# Patient Record
Sex: Female | Born: 1985 | Race: Black or African American | Hispanic: No | Marital: Single | Smoking: Current every day smoker
Health system: Southern US, Community
[De-identification: ages and names within clinical notes are randomized; demographics above are authoritative.]

## PROBLEM LIST (undated history)

## (undated) ENCOUNTER — Emergency Department (HOSPITAL_COMMUNITY): Admission: EM | Discharge status: Absent without leave | Payer: Medicaid Other

## (undated) ENCOUNTER — Inpatient Hospital Stay (HOSPITAL_COMMUNITY): Payer: Self-pay

## (undated) DIAGNOSIS — F32A Depression, unspecified: Secondary | ICD-10-CM

## (undated) DIAGNOSIS — F419 Anxiety disorder, unspecified: Secondary | ICD-10-CM

## (undated) DIAGNOSIS — F329 Major depressive disorder, single episode, unspecified: Secondary | ICD-10-CM

## (undated) DIAGNOSIS — O139 Gestational [pregnancy-induced] hypertension without significant proteinuria, unspecified trimester: Secondary | ICD-10-CM

## (undated) HISTORY — PX: NO PAST SURGERIES: SHX2092

---
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
## 2005-09-14 ENCOUNTER — Emergency Department (HOSPITAL_COMMUNITY): Admission: EM | Admit: 2005-09-14 | Discharge: 2005-09-14 | Payer: Self-pay | Admitting: Emergency Medicine

## 2006-01-19 ENCOUNTER — Emergency Department (HOSPITAL_COMMUNITY): Admission: EM | Admit: 2006-01-19 | Discharge: 2006-01-19 | Payer: Self-pay | Admitting: Emergency Medicine

## 2008-01-12 ENCOUNTER — Emergency Department (HOSPITAL_COMMUNITY): Admission: EM | Admit: 2008-01-12 | Discharge: 2008-01-13 | Payer: Self-pay | Admitting: Emergency Medicine

## 2009-02-28 ENCOUNTER — Inpatient Hospital Stay (HOSPITAL_COMMUNITY): Admission: AD | Admit: 2009-02-28 | Discharge: 2009-02-28 | Payer: Self-pay | Admitting: Obstetrics and Gynecology

## 2009-05-22 ENCOUNTER — Inpatient Hospital Stay (HOSPITAL_COMMUNITY): Admission: AD | Admit: 2009-05-22 | Discharge: 2009-05-22 | Payer: Self-pay | Admitting: Obstetrics & Gynecology

## 2009-07-09 ENCOUNTER — Inpatient Hospital Stay (HOSPITAL_COMMUNITY): Admission: AD | Admit: 2009-07-09 | Discharge: 2009-07-10 | Payer: Self-pay | Admitting: Obstetrics & Gynecology

## 2009-08-06 ENCOUNTER — Inpatient Hospital Stay (HOSPITAL_COMMUNITY): Admission: AD | Admit: 2009-08-06 | Discharge: 2009-08-06 | Payer: Self-pay | Admitting: Obstetrics and Gynecology

## 2009-08-14 ENCOUNTER — Ambulatory Visit: Payer: Self-pay | Admitting: Obstetrics & Gynecology

## 2009-09-01 ENCOUNTER — Ambulatory Visit: Payer: Self-pay | Admitting: Obstetrics & Gynecology

## 2009-09-18 ENCOUNTER — Encounter: Payer: Self-pay | Admitting: Obstetrics & Gynecology

## 2009-09-18 ENCOUNTER — Ambulatory Visit: Payer: Self-pay | Admitting: Obstetrics & Gynecology

## 2009-09-18 LAB — CONVERTED CEMR LAB
AST: 15 units/L (ref 0–37)
Albumin: 3.6 g/dL (ref 3.5–5.2)
Alkaline Phosphatase: 215 units/L — ABNORMAL HIGH (ref 39–117)
BUN: 9 mg/dL (ref 6–23)
Chlamydia, DNA Probe: NEGATIVE
Hemoglobin: 12.7 g/dL (ref 12.0–15.0)
MCHC: 32.3 g/dL (ref 30.0–36.0)
MCV: 88.7 fL (ref 78.0–100.0)
Potassium: 4.7 meq/L (ref 3.5–5.3)
RDW: 13.4 % (ref 11.5–15.5)
Sodium: 138 meq/L (ref 135–145)
Total Bilirubin: 0.3 mg/dL (ref 0.3–1.2)

## 2009-09-19 ENCOUNTER — Encounter: Payer: Self-pay | Admitting: Obstetrics & Gynecology

## 2009-09-25 ENCOUNTER — Encounter: Payer: Self-pay | Admitting: Advanced Practice Midwife

## 2009-09-25 ENCOUNTER — Ambulatory Visit: Payer: Self-pay | Admitting: Obstetrics & Gynecology

## 2009-09-25 LAB — CONVERTED CEMR LAB
AST: 15 units/L (ref 0–37)
Alkaline Phosphatase: 219 units/L — ABNORMAL HIGH (ref 39–117)
BUN: 9 mg/dL (ref 6–23)
Creatinine, Ser: 0.56 mg/dL (ref 0.40–1.20)
Glucose, Bld: 85 mg/dL (ref 70–99)
Hemoglobin: 13.1 g/dL (ref 12.0–15.0)
MCHC: 33.3 g/dL (ref 30.0–36.0)
MCV: 86.9 fL (ref 78.0–100.0)
Potassium: 4.3 meq/L (ref 3.5–5.3)
RBC: 4.52 M/uL (ref 3.87–5.11)
RDW: 13.8 % (ref 11.5–15.5)
Total Bilirubin: 0.3 mg/dL (ref 0.3–1.2)

## 2009-09-26 ENCOUNTER — Inpatient Hospital Stay (HOSPITAL_COMMUNITY): Admission: AD | Admit: 2009-09-26 | Discharge: 2009-09-26 | Payer: Self-pay | Admitting: Family Medicine

## 2009-09-26 ENCOUNTER — Ambulatory Visit: Payer: Self-pay | Admitting: Obstetrics & Gynecology

## 2009-09-26 ENCOUNTER — Ambulatory Visit: Payer: Self-pay | Admitting: Advanced Practice Midwife

## 2009-09-26 ENCOUNTER — Encounter: Payer: Self-pay | Admitting: Obstetrics & Gynecology

## 2009-09-26 LAB — CONVERTED CEMR LAB
Collection Interval-CRCL: 24 hr
Creatinine 24 HR UR: 1779 mg/24hr (ref 700–1800)
Creatinine Clearance: 221 mL/min — ABNORMAL HIGH (ref 75–115)
Creatinine, Urine: 154.7 mg/dL
Protein, Ur: 138 mg/24hr — ABNORMAL HIGH (ref 50–100)

## 2009-10-04 ENCOUNTER — Ambulatory Visit: Payer: Self-pay | Admitting: Advanced Practice Midwife

## 2009-10-04 ENCOUNTER — Inpatient Hospital Stay (HOSPITAL_COMMUNITY): Admission: AD | Admit: 2009-10-04 | Discharge: 2009-10-06 | Payer: Self-pay | Admitting: Obstetrics & Gynecology

## 2009-10-06 ENCOUNTER — Encounter: Payer: Self-pay | Admitting: Obstetrics & Gynecology

## 2011-02-10 LAB — COMPREHENSIVE METABOLIC PANEL
ALT: 13 U/L (ref 0–35)
AST: 26 U/L (ref 0–37)
Albumin: 2.8 g/dL — ABNORMAL LOW (ref 3.5–5.2)
Albumin: 3.2 g/dL — ABNORMAL LOW (ref 3.5–5.2)
Alkaline Phosphatase: 263 U/L — ABNORMAL HIGH (ref 39–117)
BUN: 5 mg/dL — ABNORMAL LOW (ref 6–23)
Chloride: 101 mEq/L (ref 96–112)
Chloride: 105 mEq/L (ref 96–112)
Creatinine, Ser: 0.57 mg/dL (ref 0.4–1.2)
GFR calc Af Amer: >60 mL/min (ref >60)
GFR calc non Af Amer: >60 mL/min (ref >60)
Glucose, Bld: 91 mg/dL (ref 70–99)
Potassium: 4.1 mEq/L (ref 3.5–5.1)
Sodium: 134 mEq/L — ABNORMAL LOW (ref 135–145)
Total Bilirubin: 0.5 mg/dL (ref 0.3–1.2)
Total Bilirubin: 0.6 mg/dL (ref 0.3–1.2)
Total Protein: 6.9 g/dL (ref 6.0–8.3)

## 2011-02-10 LAB — POCT URINALYSIS DIP (DEVICE)
Glucose, UA: NEGATIVE mg/dL
Glucose, UA: NEGATIVE mg/dL
Nitrite: NEGATIVE
Nitrite: NEGATIVE
Protein, ur: 30 mg/dL — AB
Protein, ur: NEGATIVE mg/dL
Specific Gravity, Urine: 1.02 (ref 1.005–1.030)
Urobilinogen, UA: 0.2 mg/dL (ref 0.0–1.0)
Urobilinogen, UA: 0.2 mg/dL (ref 0.0–1.0)

## 2011-02-10 LAB — CBC
HCT: 39.1 % (ref 36.0–46.0)
HCT: 41.9 % (ref 36.0–46.0)
Platelets: 138 10*3/uL — ABNORMAL LOW (ref 150–400)
Platelets: 149 10*3/uL — ABNORMAL LOW (ref 150–400)
RDW: 14.2 % (ref 11.5–15.5)
WBC: 11.9 10*3/uL — ABNORMAL HIGH (ref 4.0–10.5)
WBC: 12.6 10*3/uL — ABNORMAL HIGH (ref 4.0–10.5)

## 2011-02-10 LAB — LIPASE, BLOOD: Lipase: 19 U/L (ref 11–59)

## 2011-02-10 LAB — RAPID URINE DRUG SCREEN, HOSP PERFORMED
Benzodiazepines: NOT DETECTED
Cocaine: NOT DETECTED
Opiates: NOT DETECTED
Tetrahydrocannabinol: POSITIVE — AB

## 2011-02-10 LAB — AMYLASE: Amylase: 109 U/L (ref 27–131)

## 2011-02-10 LAB — URIC ACID: Uric Acid, Serum: 6.5 mg/dL (ref 2.4–7.0)

## 2011-02-11 LAB — POCT URINALYSIS DIP (DEVICE)
Bilirubin Urine: NEGATIVE
Glucose, UA: NEGATIVE mg/dL
Ketones, ur: NEGATIVE mg/dL
Nitrite: NEGATIVE
Nitrite: NEGATIVE
Protein, ur: NEGATIVE mg/dL
Urobilinogen, UA: 1 mg/dL (ref 0.0–1.0)
pH: 7 (ref 5.0–8.0)
pH: 7 (ref 5.0–8.0)

## 2011-02-12 LAB — URINALYSIS, ROUTINE W REFLEX MICROSCOPIC
Bilirubin Urine: NEGATIVE
Ketones, ur: NEGATIVE mg/dL
Nitrite: NEGATIVE
Protein, ur: NEGATIVE mg/dL
Urobilinogen, UA: 0.2 mg/dL (ref 0.0–1.0)
pH: 6.5 (ref 5.0–8.0)

## 2011-02-12 LAB — URIC ACID: Uric Acid, Serum: 5 mg/dL (ref 2.4–7.0)

## 2011-02-12 LAB — CBC
MCHC: 32.9 g/dL (ref 30.0–36.0)
Platelets: 151 10*3/uL (ref 150–400)
RDW: 13.9 % (ref 11.5–15.5)

## 2011-02-12 LAB — COMPREHENSIVE METABOLIC PANEL
ALT: 12 U/L (ref 0–35)
AST: 20 U/L (ref 0–37)
Albumin: 3 g/dL — ABNORMAL LOW (ref 3.5–5.2)
Alkaline Phosphatase: 141 U/L — ABNORMAL HIGH (ref 39–117)
Calcium: 9.1 mg/dL (ref 8.4–10.5)
GFR calc Af Amer: >60 mL/min (ref >60)
Glucose, Bld: 112 mg/dL — ABNORMAL HIGH (ref 70–99)
Potassium: 3.8 mEq/L (ref 3.5–5.1)
Sodium: 136 mEq/L (ref 135–145)
Total Protein: 6.7 g/dL (ref 6.0–8.3)

## 2011-02-14 LAB — URINALYSIS, ROUTINE W REFLEX MICROSCOPIC
Bilirubin Urine: NEGATIVE
Glucose, UA: NEGATIVE mg/dL
Nitrite: NEGATIVE
Specific Gravity, Urine: 1.025 (ref 1.005–1.030)
pH: 6 (ref 5.0–8.0)

## 2011-02-17 LAB — CBC
Hemoglobin: 12.9 g/dL (ref 12.0–15.0)
RBC: 4.19 MIL/uL (ref 3.87–5.11)
WBC: 9.4 10*3/uL (ref 4.0–10.5)

## 2011-02-17 LAB — WET PREP, GENITAL: Trich, Wet Prep: NONE SEEN

## 2011-02-17 LAB — URINALYSIS, ROUTINE W REFLEX MICROSCOPIC
Hgb urine dipstick: NEGATIVE
Nitrite: NEGATIVE
Specific Gravity, Urine: 1.02 (ref 1.005–1.030)
Urobilinogen, UA: 4 mg/dL — ABNORMAL HIGH (ref 0.0–1.0)
pH: 7 (ref 5.0–8.0)

## 2011-02-17 LAB — ABO/RH: ABO/RH(D): A POS

## 2011-02-17 LAB — HCG, QUANTITATIVE, PREGNANCY: hCG, Beta Chain, Quant, S: 55900 m[IU]/mL — ABNORMAL HIGH (ref <5)

## 2011-03-26 NOTE — Discharge Summary (Signed)
NAMEFRANCIA, Cindy                         ACCOUNT NO.:  [id number]   MEDICAL RECORD NO.:  [id number]                   PATIENT TYPE:   LOCATION:                                       FACILITY:   PHYSICIAN:  Phil D. Okey Dupre, M.D.                  DATE OF BIRTH:   DATE OF ADMISSION:  01/23/2003  DATE OF DISCHARGE:  01/30/2003                                 DISCHARGE SUMMARY   ATTENDING:  Javier Glazier. Okey Dupre, M.D.   INTERN:  Alvira Philips, M.D.   DISCHARGE DIAGNOSES:  1. Preeclampsia.  2. Large-for-gestational-age child.  3. Depression.  4. Tobacco abuse.   The patient discharged with preterm labor precautions and told to return if  headache worse or if she starts seeing any increasing spots in her visual  field or has increasing swelling in her hands or face, or if she has any  right upper quadrant pain.  The patient was not given any discharge  medications.  She is to continue prenatal vitamins.  No restrictions in her  diet.  She is to be maintained on bedrest.  Activity is no sexual activity.  She is to return to high risk clinic on February 01, 2003 at 10 a.m. for follow-  up.   BRIEF ADMISSION HISTORY AND PHYSICAL:  The patient was a 25 year old African-  American female G3 P0-2-0-2 who presented at 31 and three-sevenths weeks.  She presented with the history of PIH and that she was having the worst  headache of her life.  She has a history of two preterm deliveries.  Other  risk factors include teenage pregnancy and substance abuse.  She has been  getting her healthcare at [facility].  Prenatal labs include she is A  positive and negative antibody, hemoglobin 10.3, rubella immune, GC and  chlamydia negative, HIV questionable, and GBS negative, and one-hour Glucola  of 119.  On initial exam the patient was afebrile, blood pressure was  142/78.  Digital cervical exam showed fingertip, 60% dilation, -3.  She had  good fetal reactivity and irregular contractions.  She was  admitted for  preeclampsia and rule out preterm labor.   The patient was initially started on magnesium.  Initial PIH labs showed  only uric acid borderline at 4.5, an LDH of less than 105.  She had no right  upper quadrant tenderness on admission.  BUN and creatinine were 4/0.5.  Platelets were 166 at time of admission.  The patient was started on  magnesium and monitored and had PIH monitoring as well as fetal strip  monitoring.  The patient also had a CT scan of the head which showed  negative for any intravascular lesions or any acute bleeds.  The patient had  an OB ultrasound for growth and position that showed a 34 week three day  gestational age with fetal macrosomia.  The baby's weight was 2740 grams  which was in the 90th to 95th percentile.  She had an AFI of 10.3, cephalic  presentation, and anterior placenta.  Abdominal circumference was leading  the parameter by approximately two weeks.  The patient's headache slowly  resolved throughout hospital stay and uric acid had a slight tendency to  trend upwards to a high of 5.2.  Other preeclamptic labs remained normal.  She had a 24-hour urine which showed 131 mg of protein over 24 hours.  The  patient at no point in time displayed any other PIH symptoms other than  headaches.  She occasionally had some occasional scotomata.  The patient had  occasional uterine irritability on the fetal monitor but no true uterine  contractions.  PIH labs remained normal throughout hospital stay.  The  patient had amniocentesis performed on January 28, 2003 to assess fetal lung  maturity which showed L/S ratio of 2.1:1, PG negative.  This indicates fetal  lung maturity at this point in time.  The patient on day of discharge was  headache-free x2 days.  She had no previous history of headaches prior to  admission.  The headache she continued to describe as unilateral over the  left temporal artery, occasional nausea, with severe in nature could also   likely represent migraine-type headaches.  The patient at time of discharge  on January 30, 2003 had no criteria for delivery as headaches were stable.  She has fetal lung maturity and no other abnormalities in the [facility] labs or  other PIH symptoms.  The patient was given preterm labor precautions and  instructions, told to return if headaches worsened or if she started having  any right upper quadrant pain or increased lower extremity or hand or facial  edema.  The patient will follow up in high risk clinic and will watch  carefully for presentation of reasons for delivery or mature fetus at 37-38  weeks.     Alvira Philips, M.D.                      Phil D. Okey Dupre, M.D.    RM/MEDQ  D:  06/10/2003  T:  06/11/2003  Job:  [id number]

## 2011-03-26 NOTE — Discharge Summary (Signed)
[facility]  Patient:    Cindy Gardner, Cindy Gardner                    MRN: [id number] Adm. Date:  [id number] Disc. Date: [id number] Attending:  Tammi Sou Dictator:   Ellwood Handler, M.D.                           Discharge Summary  DISCHARGE DIAGNOSIS:          Preterm contractions.  DISCHARGE MEDICATIONS:        Prenatal vitamins one p.o. q.d.  HISTORY OF PRESENT ILLNESS:   In brief, this is a 25 year old, gravida 2, para 0-1-0-1, who presented at 33 weeks ______ for several hours. The patient denies other complaints. Her previous pregnancy was complicated by preterm labor with delivery at 23 weeks.  PHYSICAL EXAMINATION ON ADMISSION:  VITAL SIGNS:                  Temperature 98.4, pulse 101, respiratory rate 24, blood pressure 127/64.  GENERAL:                      Well appearing in no acute distress.  HEART:                        Regular rate and rhythm. No murmurs appreciated.  LUNGS:                        Clear to auscultation bilaterally.  ABDOMEN:                      Gravid, nontender.  EXTREMITIES:                  1+ edema.  STERILE VAGINAL EXAM:         Cervix was closed and long.  ELECTRONIC FETAL MONITOR:     Baseline fetal heart rate 130-140 with good variability, accelerations present, also some mild variables. Patient contracting every two to four minutes with contractions lasting 30 to 50 seconds.  HOSPITAL COURSE:              The patient was admitted and started on magnesium sulfate 2 g IV/hr, Unasyn 3 g IV q.6h. By second hospital day, uterine contractions were much less frequent. Cervical exam remained unchanged and magnesium sulfate was stopped. Patient was started on Procardia XL 60 q.d., however, contractions increased in frequency while on Procardia. Procardia was stopped and patient given terbutaline 0.25 mg subcutaneously x 1 dose followed by terbutaline 2.5 mg p.o. q.4h. Patient remained on Unasyn 3  g IV throughout hospital stay secondary to group B strep positive status. Social work was consulted on this patient because she is a resident of the [facility] [place]. The patient is presently on probation. The social worker spoke with the patients juvenile court counselor as well as her DSS worker and arranged the patients transportation back to [facility] upon discharge.  DISCHARGE INSTRUCTIONS:       Patient given preterm labor precautions and told to seek immediate medical attention if she develops menstrual-like cramps, increasing uterine contractions, pelvic pressure, increase or change in vaginal discharge, vaginal bleeding, or leakage of fluid.  DISCHARGE FOLLOWUP:           Patient will return to [facility], [facility] for followup care. DD:  06/07/01 TD:  06/07/01 Job: [id number]

## 2011-03-26 NOTE — Discharge Summary (Signed)
NAME:  Cindy Gardner, Cindy Gardner                       ACCOUNT NO.:  [id number]   MEDICAL RECORD NO.:  [id number]                   PATIENT TYPE:  INP   LOCATION:  9106                                 FACILITY:  [facility]   PHYSICIAN:  Phil D. Okey Dupre, M.D.                  DATE OF BIRTH:  08-13-1986   DATE OF ADMISSION:  01/09/2003  DATE OF DISCHARGE:  01/13/2003                                 DISCHARGE SUMMARY   ADMISSION DIAGNOSES:  1. A 25 year old G3 P0-2-0-2 at 86 and two weeks with elevated blood     pressures.  2. History of preterm deliveries x2.   DISCHARGE DIAGNOSES:  1. A 25 year old G3 P0-2-0-2 at 55 and six weeks with pregnancy-induced     hypertension.  2. History of preterm deliveries x2.   DISCHARGE MEDICATIONS:  Prenatal vitamins one p.o. daily.   HOSPITAL COURSE:  The patient is a 25 year old G3 P0-2-0-2 who presented at  54 and two weeks at the high risk clinic with elevated blood pressures.  Her  past OB/GYN history is significant for her first delivery at 23 weeks at  delivery, and her second pregnancy a 33 week induction for severe  preeclampsia.   She was brought into the hospital for blood pressure monitoring and further  laboratory workup.  A 24-hour urine was done which showed a protein level of  119 mg.  The urine sample was adequate.  Lupus anticoagulant was done which  was not detected.  PIH labs were obtained several times, all within normal  limits, with uric acid levels borderline at 4.5.  A three-hour glucose  tolerance test was done.  The fasting was 80, one-hour 119, two-hour 123,  and three-hour 123.  Her ANA was negative and her anticardiolipin antibody  was less than 14 which is negative.   During her hospital stay she did have a few episodes of emesis and some  lower extremity edema.  Her blood pressures on hospital days #1 and #2  ranged from the 120s to 150s systolic and 60s to 80 diastolic.  The day  prior to discharge her blood pressure maximum  was at 166/88 but she remained  asymptomatic.  On the day of discharge she felt much better.  She had no  vomiting for several days, she had no lower extremity edema, and she felt  overall well.  She had no uterine contractions for 48 hours prior to  discharge.  Fetal heart rate was reassuring throughout her hospital stay.   The patient was given betamethasone IM x2 in this hospital stay.  Her white  blood cell count on day of discharge increased to 28,000.  This may have  been demargination from the steroids, but since it was so elevated the  patient was cautioned on febrile illnesses and told to call high risk clinic  with any symptoms.  Currently, she has no evidence of any infectious  process.  She had been afebrile throughout her hospital stay.   CONDITION ON DISCHARGE:  The patient was discharged to home in stable  condition with blood pressures in the 120s over 70s on day of discharge.    INSTRUCTIONS GIVEN TO THE PATIENT:  1. The patient was told to continue with her prenatal vitamin once a day.  2. She was told to call the clinic to ensure that she had an appointment     this coming week.  3. She was discharged with preterm labor precautions.     Maurice March, M.D.                        Phil D. Okey Dupre, M.D.    LC/MEDQ  D:  01/13/2003  T:  01/14/2003  Job:  [id number]

## 2011-04-24 ENCOUNTER — Inpatient Hospital Stay (HOSPITAL_COMMUNITY)
Admission: AD | Admit: 2011-04-24 | Discharge: 2011-04-24 | Disposition: A | Discharge status: Home / Self Care | Payer: Medicaid Other | Source: Ambulatory Visit | Attending: Obstetrics & Gynecology | Admitting: Obstetrics & Gynecology

## 2011-04-24 DIAGNOSIS — B9689 Other specified bacterial agents as the cause of diseases classified elsewhere: Secondary | ICD-10-CM

## 2011-04-24 DIAGNOSIS — A499 Bacterial infection, unspecified: Secondary | ICD-10-CM | POA: Insufficient documentation

## 2011-04-24 DIAGNOSIS — M549 Dorsalgia, unspecified: Secondary | ICD-10-CM | POA: Insufficient documentation

## 2011-04-24 DIAGNOSIS — O2390 Unspecified genitourinary tract infection in pregnancy, unspecified trimester: Secondary | ICD-10-CM

## 2011-04-24 DIAGNOSIS — N760 Acute vaginitis: Secondary | ICD-10-CM

## 2011-04-24 LAB — URINALYSIS, ROUTINE W REFLEX MICROSCOPIC
Bilirubin Urine: NEGATIVE
Glucose, UA: NEGATIVE mg/dL
Hgb urine dipstick: NEGATIVE
Specific Gravity, Urine: >1.03 — ABNORMAL HIGH (ref 1.005–1.030)
pH: 7 (ref 5.0–8.0)

## 2011-04-24 LAB — WET PREP, GENITAL: Trich, Wet Prep: NONE SEEN

## 2011-04-26 LAB — GC/CHLAMYDIA PROBE AMP, GENITAL: GC Probe Amp, Genital: NEGATIVE

## 2011-05-28 ENCOUNTER — Encounter (HOSPITAL_COMMUNITY): Payer: Self-pay | Admitting: *Deleted

## 2011-05-28 ENCOUNTER — Inpatient Hospital Stay (HOSPITAL_COMMUNITY)
Admission: AD | Admit: 2011-05-28 | Discharge: 2011-05-28 | Disposition: A | Discharge status: Home / Self Care | Payer: Medicaid Other | Source: Ambulatory Visit | Attending: Obstetrics & Gynecology | Admitting: Obstetrics & Gynecology

## 2011-05-28 ENCOUNTER — Inpatient Hospital Stay (HOSPITAL_COMMUNITY): Payer: Medicaid Other

## 2011-05-28 DIAGNOSIS — O99891 Other specified diseases and conditions complicating pregnancy: Secondary | ICD-10-CM | POA: Insufficient documentation

## 2011-05-28 DIAGNOSIS — O4690 Antepartum hemorrhage, unspecified, unspecified trimester: Secondary | ICD-10-CM

## 2011-05-28 DIAGNOSIS — O99330 Smoking (tobacco) complicating pregnancy, unspecified trimester: Secondary | ICD-10-CM | POA: Insufficient documentation

## 2011-05-28 DIAGNOSIS — W108XXA Fall (on) (from) other stairs and steps, initial encounter: Secondary | ICD-10-CM | POA: Insufficient documentation

## 2011-05-28 HISTORY — DX: Gestational (pregnancy-induced) hypertension without significant proteinuria, unspecified trimester: O13.9

## 2011-05-28 HISTORY — DX: Depression, unspecified: F32.A

## 2011-05-28 HISTORY — DX: Major depressive disorder, single episode, unspecified: F32.9

## 2011-05-28 LAB — URINALYSIS, ROUTINE W REFLEX MICROSCOPIC
Bilirubin Urine: NEGATIVE
Hgb urine dipstick: NEGATIVE
Specific Gravity, Urine: 1.025 (ref 1.005–1.030)
Urobilinogen, UA: 0.2 mg/dL (ref 0.0–1.0)

## 2011-05-28 LAB — CBC
HCT: 36.7 % (ref 36.0–46.0)
MCV: 87.8 fL (ref 78.0–100.0)
RBC: 4.18 MIL/uL (ref 3.87–5.11)
RDW: 12.8 % (ref 11.5–15.5)
WBC: 13.1 10*3/uL — ABNORMAL HIGH (ref 4.0–10.5)
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
## 2011-06-07 NOTE — ED Provider Notes (Signed)
Agree with above note.  Cindy Gardner 06/07/2011 3:08 PM

## 2011-07-06 LAB — HEPATITIS B SURFACE ANTIGEN: Hepatitis B Surface Ag: NEGATIVE

## 2011-07-06 LAB — RUBELLA ANTIBODY, IGM: Rubella: IMMUNE

## 2011-07-06 LAB — RPR: RPR: NONREACTIVE

## 2011-08-02 LAB — POCT I-STAT CREATININE
Creatinine, Ser: 1.3 — ABNORMAL HIGH
Operator id: 133351

## 2011-08-02 LAB — I-STAT 8, (EC8 V) (CONVERTED LAB)
BUN: 10
Chloride: 104
HCT: 48 — ABNORMAL HIGH
pCO2, Ven: 40.5 — ABNORMAL LOW
pH, Ven: 7.346 — ABNORMAL HIGH

## 2011-08-02 LAB — DIFFERENTIAL
Eosinophils Relative: 0
Lymphs Abs: 2.8
Monocytes Relative: 25 — ABNORMAL HIGH
Neutro Abs: 0.8 — ABNORMAL LOW

## 2011-08-02 LAB — CBC
HCT: 43.4
Hemoglobin: 14.8
RBC: 4.81
WBC: 4.9

## 2011-08-02 LAB — URINALYSIS, ROUTINE W REFLEX MICROSCOPIC
Bilirubin Urine: NEGATIVE
Hgb urine dipstick: NEGATIVE
Specific Gravity, Urine: 1.033 — ABNORMAL HIGH
pH: 5.5

## 2011-08-02 LAB — WET PREP, GENITAL: Clue Cells Wet Prep HPF POC: NONE SEEN

## 2011-08-02 LAB — POCT PREGNANCY, URINE: Operator id: 133351

## 2011-08-02 LAB — RPR: RPR Ser Ql: NONREACTIVE

## 2011-08-03 LAB — HIV ANTIBODY (ROUTINE TESTING W REFLEX)
HIV: NONREACTIVE
HIV: NONREACTIVE
HIV: NONREACTIVE

## 2011-08-03 LAB — RPR
RPR: NONREACTIVE
RPR: NONREACTIVE

## 2011-10-21 ENCOUNTER — Encounter (HOSPITAL_COMMUNITY): Payer: Self-pay | Admitting: *Deleted

## 2011-10-21 ENCOUNTER — Inpatient Hospital Stay (HOSPITAL_COMMUNITY)
Admission: AD | Admit: 2011-10-21 | Discharge: 2011-10-21 | Disposition: A | Discharge status: Home / Self Care | Payer: Medicaid Other | Source: Ambulatory Visit | Attending: Obstetrics | Admitting: Obstetrics

## 2011-10-21 DIAGNOSIS — R51 Headache: Secondary | ICD-10-CM

## 2011-10-21 DIAGNOSIS — D696 Thrombocytopenia, unspecified: Secondary | ICD-10-CM | POA: Insufficient documentation

## 2011-10-21 DIAGNOSIS — M549 Dorsalgia, unspecified: Secondary | ICD-10-CM | POA: Insufficient documentation

## 2011-10-21 DIAGNOSIS — O26899 Other specified pregnancy related conditions, unspecified trimester: Secondary | ICD-10-CM

## 2011-10-21 DIAGNOSIS — O99891 Other specified diseases and conditions complicating pregnancy: Secondary | ICD-10-CM | POA: Insufficient documentation

## 2011-10-21 DIAGNOSIS — G4700 Insomnia, unspecified: Secondary | ICD-10-CM | POA: Insufficient documentation

## 2011-10-21 DIAGNOSIS — D689 Coagulation defect, unspecified: Secondary | ICD-10-CM | POA: Insufficient documentation

## 2011-10-21 LAB — COMPREHENSIVE METABOLIC PANEL
ALT: 10 U/L (ref 0–35)
AST: 15 U/L (ref 0–37)
Albumin: 2.7 g/dL — ABNORMAL LOW (ref 3.5–5.2)
Alkaline Phosphatase: 158 U/L — ABNORMAL HIGH (ref 39–117)
BUN: 6 mg/dL (ref 6–23)
Chloride: 102 mEq/L (ref 96–112)
Potassium: 4.4 mEq/L (ref 3.5–5.1)
Sodium: 132 mEq/L — ABNORMAL LOW (ref 135–145)
Total Bilirubin: 0.2 mg/dL — ABNORMAL LOW (ref 0.3–1.2)

## 2011-10-21 LAB — CBC
HCT: 34 % — ABNORMAL LOW (ref 36.0–46.0)
MCH: 28.1 pg (ref 26.0–34.0)
MCHC: 32.4 g/dL (ref 30.0–36.0)
MCV: 87 fL (ref 78.0–100.0)
Platelets: 124 10*3/uL — ABNORMAL LOW (ref 150–400)
RDW: 13.2 % (ref 11.5–15.5)

## 2011-10-21 LAB — DIFFERENTIAL
Basophils Absolute: 0 10*3/uL (ref 0.0–0.1)
Eosinophils Absolute: 0.4 10*3/uL (ref 0.0–0.7)
Eosinophils Relative: 4 % (ref 0–5)
Monocytes Absolute: 1.6 10*3/uL — ABNORMAL HIGH (ref 0.1–1.0)

## 2011-10-21 LAB — URIC ACID: Uric Acid, Serum: 4.9 mg/dL (ref 2.4–7.0)

## 2011-10-21 LAB — URINALYSIS, ROUTINE W REFLEX MICROSCOPIC
Bilirubin Urine: NEGATIVE
Leukocytes, UA: NEGATIVE
Nitrite: NEGATIVE
Specific Gravity, Urine: 1.02 (ref 1.005–1.030)
pH: 6 (ref 5.0–8.0)

## 2011-10-21 MED ORDER — ACETAMINOPHEN 500 MG PO TABS
1000.0000 mg | ORAL_TABLET | Freq: Once | ORAL | Status: AC
  Administered 2011-10-21: 1000 mg via ORAL
  Filled 2011-10-21: qty 2

## 2011-10-21 MED ORDER — ACETAMINOPHEN-CODEINE #3 300-30 MG PO TABS: 1.0000 | ORAL_TABLET | ORAL | Status: AC | PRN

## 2011-10-21 MED ORDER — ACETAMINOPHEN-CODEINE #3 300-30 MG PO TABS
1.0000 | ORAL_TABLET | ORAL | Status: DC | PRN
  Administered 2011-10-21: 1 via ORAL
  Filled 2011-10-21: qty 1

## 2011-10-21 NOTE — Progress Notes (Signed)
Pt reports falling after getting up to the bathroom around 0400.  Pt landed on right side.

## 2011-10-21 NOTE — ED Provider Notes (Addendum)
History     Chief Complaint  Patient presents with  . Headache  . Back Pain  . Sore Throat   HPI   Past Medical History  Diagnosis Date  . Pregnancy induced hypertension   . Depression     History reviewed. No pertinent past surgical history.  History reviewed. No pertinent family history.  History  Substance Use Topics  . Smoking status: Current Everyday Smoker -- 1.0 packs/day    Types: Cigarettes  . Smokeless tobacco: Not on file  . Alcohol Use: No    Allergies: No Known Allergies  Prescriptions prior to admission  Medication Sig Dispense Refill  . acetaminophen (TYLENOL) 500 MG tablet Take 500 mg by mouth every 6 (six) hours as needed. Pain       . Oxycodone-Acetaminophen (PERCOCET PO) Take 1 tablet by mouth every 6 (six) hours as needed. Pain; Pt does not know strength, [facility] closed.       . Sertraline HCl (ZOLOFT PO) Take 1 tablet by mouth daily. Pt does not know strength, [facility] closed       . Zolpidem Tartrate (AMBIEN PO) Take 1 tablet by mouth at bedtime as needed. Sleep; Pt does not know strength, [facility] closed         ROS Physical Exam   Blood pressure 130/78, pulse 97, temperature 98.5 F (36.9 C), temperature source Oral, resp. rate 22, height 5\' 1"  (1.549 m), weight 213 lb 9.6 oz (96.888 kg), last menstrual period 02/14/2011, SpO2 99.00%.  Physical Exam  MAU Course  Procedures  Care assumed from Deeann Cree, CNM for discharge Discharge instructions given with prescription for Tylenol #3    Cindy Gardner 10/21/2011, 8:40 AM

## 2011-10-21 NOTE — Progress Notes (Signed)
Pt G5 P4 at 35.4wks having lower back pain, headache and cold symptoms.

## 2011-10-21 NOTE — ED Provider Notes (Signed)
Cindy L McCreary25 [id number] @[redacted]w[redacted]d  Chief Complaint  Patient presents with  . Headache  . Back Pain  . Sore Throat    SUBJECTIVE  HPI: Presents for severe H/A that started 4 days ago but got worse today. Whole head hurts and has blurred vision and sore throat. Got up to the bathroom at about 0400 and felt dizzy then fell to the floor. Unsure if there was  LOC. Last had syncope with P3 when she had preeclampsia. Has had mid low back pain for weeks and states Percocet does not help. States BP was not elevated until Monday at her PN visit and she's been going regularly since August. Having problems sleeping and ran ut of Ambien. No PN record found.  No leakage of fluid or vaginal bleeding. Good FM.   Past Medical History  Diagnosis Date  . Pregnancy induced hypertension   . Depression    History reviewed. No pertinent past surgical history. History   Social History  . Marital Status: Single    Spouse Name: N/A    Number of Children: N/A  . Years of Education: N/A   Occupational History  . Not on file.   Social History Main Topics  . Smoking status: Current Everyday Smoker -- 1.0 packs/day    Types: Cigarettes  . Smokeless tobacco: Not on file  . Alcohol Use: No  . Drug Use: Yes    Special: Marijuana  . Sexually Active: Yes   Other Topics Concern  . Not on file   Social History Narrative  . No narrative on file   No current facility-administered medications on file prior to encounter.   No current outpatient prescriptions on file prior to encounter.   No Known Allergies  ROS: Pertinent items in HPI  OBJECTIVE  BP 144/87  Pulse 121  Temp(Src) 98.5 F (36.9 C) (Oral)  Resp 22  Ht 5\' 1"  (1.549 m)  Wt 96.888 kg (213 lb 9.6 oz)  BMI 40.36 kg/m2  SpO2 99%  LMP 02/14/2011  BPs 135-146/82-87  Physical Exam  Constitutional: She is oriented to person, place, and time. She appears distressed.       Holding forehead and rocking in apparent pain  HENT:  Head:  Normocephalic and atraumatic.  Mouth/Throat: Oropharynx is clear and moist.  Eyes: EOM are normal. Pupils are equal, round, and reactive to light.  Neck: Neck supple.  Cardiovascular: Normal rate, regular rhythm and normal heart sounds.   Pulmonary/Chest: Effort normal. She has no wheezes. She exhibits no tenderness.  Abdominal: Soft. There is no tenderness.  Musculoskeletal: Normal range of motion.  Lymphadenopathy:    She has no cervical adenopathy.  Neurological: She is alert and oriented to person, place, and time. She has normal reflexes. No cranial nerve deficit. Gait normal. Coordination normal.  Skin: Skin is warm and dry.  Psychiatric: Affect normal.   Results for orders placed during the hospital encounter of 10/21/11 (from the past 24 hour(s))  URINALYSIS, ROUTINE W REFLEX MICROSCOPIC     Status: Normal   Collection Time   10/21/11  5:40 AM      Component Value Range   Color, Urine YELLOW  YELLOW    APPearance CLEAR  CLEAR    Specific Gravity, Urine 1.020  1.005 - 1.030    pH 6.0  5.0 - 8.0    Glucose, UA NEGATIVE  NEGATIVE (mg/dL)   Hgb urine dipstick NEGATIVE  NEGATIVE    Bilirubin Urine NEGATIVE  NEGATIVE    Ketones,  ur NEGATIVE  NEGATIVE (mg/dL)   Protein, ur NEGATIVE  NEGATIVE (mg/dL)   Urobilinogen, UA 0.2  0.0 - 1.0 (mg/dL)   Nitrite NEGATIVE  NEGATIVE    Leukocytes, UA NEGATIVE  NEGATIVE   COMPREHENSIVE METABOLIC PANEL     Status: Abnormal   Collection Time   10/21/11  6:50 AM      Component Value Range   Sodium 132 (*) 135 - 145 (mEq/L)   Potassium 4.4  3.5 - 5.1 (mEq/L)   Chloride 102  96 - 112 (mEq/L)   CO2 21  19 - 32 (mEq/L)   Glucose, Bld 99  70 - 99 (mg/dL)   BUN 6  6 - 23 (mg/dL)   Creatinine, Ser 1.61  0.50 - 1.10 (mg/dL)   Calcium 9.4  8.4 - 09.6 (mg/dL)   Total Protein 6.7  6.0 - 8.3 (g/dL)   Albumin 2.7 (*) 3.5 - 5.2 (g/dL)   AST 15  0 - 37 (U/L)   ALT 10  0 - 35 (U/L)   Alkaline Phosphatase 158 (*) 39 - 117 (U/L)   Total Bilirubin 0.2  (*) 0.3 - 1.2 (mg/dL)   GFR calc non Af Amer >90  >90 (mL/min)   GFR calc Af Amer >90  >90 (mL/min)  CBC     Status: Abnormal   Collection Time   10/21/11  6:50 AM      Component Value Range   WBC 9.7  4.0 - 10.5 (K/uL)   RBC 3.91  3.87 - 5.11 (MIL/uL)   Hemoglobin 11.0 (*) 12.0 - 15.0 (g/dL)   HCT 04.5 (*) 40.9 - 46.0 (%)   MCV 87.0  78.0 - 100.0 (fL)   MCH 28.1  26.0 - 34.0 (pg)   MCHC 32.4  30.0 - 36.0 (g/dL)   RDW 81.1  91.4 - 78.2 (%)   Platelets 124 (*) 150 - 400 (K/uL)  DIFFERENTIAL     Status: Abnormal   Collection Time   10/21/11  6:50 AM      Component Value Range   Neutrophils Relative 56  43 - 77 (%)   Neutro Abs 5.4  1.7 - 7.7 (K/uL)   Lymphocytes Relative 24  12 - 46 (%)   Lymphs Abs 2.3  0.7 - 4.0 (K/uL)   Monocytes Relative 16 (*) 3 - 12 (%)   Monocytes Absolute 1.6 (*) 0.1 - 1.0 (K/uL)   Eosinophils Relative 4  0 - 5 (%)   Eosinophils Absolute 0.4  0.0 - 0.7 (K/uL)   Basophils Relative 0  0 - 1 (%)   Basophils Absolute 0.0  0.0 - 0.1 (K/uL)  URIC ACID     Status: Normal   Collection Time   10/21/11  6:50 AM      Component Value Range   Uric Acid, Serum 4.9  2.4 - 7.0 (mg/dL)   MAU Course: Minimal relief from Tylenol. Will give Tylenol #3  ASSESSMENT  [id number] @ [redacted]w[redacted]d Headache and insomnia Borderline thrombocytopenia (baseline 188k)   PLAN C/W Dr. Gaynell Face: Rx Tylenol #3 and Ambien Call office tomorrow for F/U if not improved.

## 2011-11-19 ENCOUNTER — Encounter (HOSPITAL_COMMUNITY): Payer: Self-pay | Admitting: Anesthesiology

## 2011-11-19 ENCOUNTER — Encounter (HOSPITAL_COMMUNITY): Payer: Self-pay | Admitting: *Deleted

## 2011-11-19 ENCOUNTER — Inpatient Hospital Stay (HOSPITAL_COMMUNITY)
Admission: AD | Admit: 2011-11-19 | Discharge: 2011-11-23 | DRG: 766 | Disposition: A | Discharge status: Home / Self Care | Payer: Medicaid Other | Source: Ambulatory Visit | Attending: Obstetrics & Gynecology | Admitting: Obstetrics & Gynecology

## 2011-11-19 DIAGNOSIS — Z98891 History of uterine scar from previous surgery: Secondary | ICD-10-CM

## 2011-11-19 DIAGNOSIS — O1400 Mild to moderate pre-eclampsia, unspecified trimester: Secondary | ICD-10-CM

## 2011-11-19 DIAGNOSIS — O139 Gestational [pregnancy-induced] hypertension without significant proteinuria, unspecified trimester: Principal | ICD-10-CM | POA: Diagnosis present

## 2011-11-19 HISTORY — DX: Anxiety disorder, unspecified: F41.9

## 2011-11-19 LAB — COMPREHENSIVE METABOLIC PANEL
ALT: 8 U/L (ref 0–35)
AST: 16 U/L (ref 0–37)
Albumin: 2.9 g/dL — ABNORMAL LOW (ref 3.5–5.2)
CO2: 22 mEq/L (ref 19–32)
Calcium: 9.4 mg/dL (ref 8.4–10.5)
Chloride: 104 mEq/L (ref 96–112)
Creatinine, Ser: 0.58 mg/dL (ref 0.50–1.10)
GFR calc non Af Amer: >90 mL/min (ref >90)
Sodium: 134 mEq/L — ABNORMAL LOW (ref 135–145)
Total Bilirubin: 0.4 mg/dL (ref 0.3–1.2)

## 2011-11-19 LAB — LACTATE DEHYDROGENASE: LDH: 140 U/L (ref 94–250)

## 2011-11-19 LAB — CBC
HCT: 35.3 % — ABNORMAL LOW (ref 36.0–46.0)
Hemoglobin: 11.6 g/dL — ABNORMAL LOW (ref 12.0–15.0)
MCH: 28.1 pg (ref 26.0–34.0)
MCHC: 32.9 g/dL (ref 30.0–36.0)
Platelets: 124 10*3/uL — ABNORMAL LOW (ref 150–400)
RBC: 4.16 MIL/uL (ref 3.87–5.11)
RDW: 13.9 % (ref 11.5–15.5)
WBC: 11.2 10*3/uL — ABNORMAL HIGH (ref 4.0–10.5)

## 2011-11-19 LAB — URINALYSIS, ROUTINE W REFLEX MICROSCOPIC
Glucose, UA: NEGATIVE mg/dL
Ketones, ur: NEGATIVE mg/dL
Leukocytes, UA: NEGATIVE
Nitrite: NEGATIVE
Specific Gravity, Urine: 1.025 (ref 1.005–1.030)
pH: 6.5 (ref 5.0–8.0)

## 2011-11-19 LAB — RAPID URINE DRUG SCREEN, HOSP PERFORMED
Cocaine: NOT DETECTED
Opiates: NOT DETECTED

## 2011-11-19 MED ORDER — ACETAMINOPHEN 325 MG PO TABS: 650.0000 mg | ORAL_TABLET | ORAL | Status: DC | PRN

## 2011-11-19 MED ORDER — FENTANYL 2.5 MCG/ML BUPIVACAINE 1/10 % EPIDURAL INFUSION (WH - ANES)
14.0000 mL/h | INTRAMUSCULAR | Status: DC
  Administered 2011-11-19: 14 mL/h via EPIDURAL
  Filled 2011-11-19 (×2): qty 60

## 2011-11-19 MED ORDER — PHENYLEPHRINE 40 MCG/ML (10ML) SYRINGE FOR IV PUSH (FOR BLOOD PRESSURE SUPPORT)
80.0000 ug | PREFILLED_SYRINGE | INTRAVENOUS | Status: DC | PRN
  Administered 2011-11-19: 80 ug via INTRAVENOUS

## 2011-11-19 MED ORDER — OXYTOCIN 20 UNITS IN LACTATED RINGERS INFUSION - SIMPLE: 1.0000 m[IU]/min | INTRAVENOUS | Status: DC

## 2011-11-19 MED ORDER — LACTATED RINGERS IV SOLN
INTRAVENOUS | Status: DC
  Administered 2011-11-19: 09:00:00 via INTRAVENOUS

## 2011-11-19 MED ORDER — LIDOCAINE HCL 1.5 % IJ SOLN
INTRAMUSCULAR | Status: DC | PRN
  Administered 2011-11-19 (×2): 5 mL via EPIDURAL

## 2011-11-19 MED ORDER — EPHEDRINE 5 MG/ML INJ
5.0000 mg | Freq: Once | INTRAVENOUS | Status: AC
  Administered 2011-11-19: 16:00:00 via INTRAVENOUS

## 2011-11-19 MED ORDER — CEFAZOLIN SODIUM 1-5 GM-% IV SOLN
1.0000 g | Freq: Three times a day (TID) | INTRAVENOUS | Status: DC
  Administered 2011-11-19: 1 g via INTRAVENOUS
  Filled 2011-11-19 (×3): qty 50

## 2011-11-19 MED ORDER — LACTATED RINGERS IV SOLN
INTRAVENOUS | Status: DC
  Administered 2011-11-19 – 2011-11-20 (×3): via INTRAVENOUS

## 2011-11-19 MED ORDER — LIDOCAINE HCL (PF) 1 % IJ SOLN: 30.0000 mL | INTRAMUSCULAR | Status: DC | PRN

## 2011-11-19 MED ORDER — IBUPROFEN 600 MG PO TABS: 600.0000 mg | ORAL_TABLET | Freq: Four times a day (QID) | ORAL | Status: DC | PRN

## 2011-11-19 MED ORDER — TERBUTALINE SULFATE 1 MG/ML IJ SOLN: 0.2500 mg | Freq: Once | INTRAMUSCULAR | Status: AC | PRN

## 2011-11-19 MED ORDER — MAGNESIUM SULFATE 40 G IN LACTATED RINGERS - SIMPLE
2.0000 g/h | INTRAVENOUS | Status: DC
  Filled 2011-11-19: qty 500

## 2011-11-19 MED ORDER — OXYTOCIN 20 UNITS IN LACTATED RINGERS INFUSION - SIMPLE
1.0000 m[IU]/min | INTRAVENOUS | Status: DC
  Administered 2011-11-19: 2 m[IU]/min via INTRAVENOUS
  Filled 2011-11-19: qty 1000

## 2011-11-19 MED ORDER — FLEET ENEMA 7-19 GM/118ML RE ENEM: 1.0000 | ENEMA | RECTAL | Status: DC | PRN

## 2011-11-19 MED ORDER — PHENYLEPHRINE 40 MCG/ML (10ML) SYRINGE FOR IV PUSH (FOR BLOOD PRESSURE SUPPORT)
80.0000 ug | PREFILLED_SYRINGE | INTRAVENOUS | Status: DC | PRN
  Administered 2011-11-19: 80 ug via INTRAVENOUS
  Filled 2011-11-19: qty 5

## 2011-11-19 MED ORDER — LABETALOL HCL 5 MG/ML IV SOLN
20.0000 mg | Freq: Once | INTRAVENOUS | Status: AC
  Administered 2011-11-19: 20 mg via INTRAVENOUS
  Filled 2011-11-19: qty 4

## 2011-11-19 MED ORDER — CITRIC ACID-SODIUM CITRATE 334-500 MG/5ML PO SOLN
30.0000 mL | ORAL | Status: DC | PRN
  Administered 2011-11-19: 30 mL via ORAL
  Filled 2011-11-19 (×2): qty 15

## 2011-11-19 MED ORDER — OXYCODONE-ACETAMINOPHEN 5-325 MG PO TABS
2.0000 | ORAL_TABLET | Freq: Once | ORAL | Status: AC
  Administered 2011-11-19: 2 via ORAL
  Filled 2011-11-19: qty 2

## 2011-11-19 MED ORDER — BUTORPHANOL TARTRATE 2 MG/ML IJ SOLN: 1.0000 mg | INTRAMUSCULAR | Status: DC | PRN

## 2011-11-19 MED ORDER — MAGNESIUM SULFATE BOLUS VIA INFUSION
4.0000 g | Freq: Once | INTRAVENOUS | Status: AC
  Administered 2011-11-19: 4 g via INTRAVENOUS
  Filled 2011-11-19: qty 500

## 2011-11-19 MED ORDER — EPHEDRINE 5 MG/ML INJ
10.0000 mg | INTRAVENOUS | Status: DC | PRN
  Filled 2011-11-19 (×2): qty 4

## 2011-11-19 MED ORDER — OXYCODONE-ACETAMINOPHEN 5-325 MG PO TABS: 2.0000 | ORAL_TABLET | ORAL | Status: DC | PRN

## 2011-11-19 MED ORDER — LACTATED RINGERS IV SOLN: 500.0000 mL | Freq: Once | INTRAVENOUS | Status: DC

## 2011-11-19 MED ORDER — OXYTOCIN 20 UNITS IN LACTATED RINGERS INFUSION - SIMPLE: 125.0000 mL/h | Freq: Once | INTRAVENOUS | Status: DC

## 2011-11-19 MED ORDER — LABETALOL HCL 5 MG/ML IV SOLN
20.0000 mg | Freq: Once | INTRAVENOUS | Status: AC
  Administered 2011-11-19: 20 mg via INTRAVENOUS

## 2011-11-19 MED ORDER — LACTATED RINGERS IV SOLN
500.0000 mL | INTRAVENOUS | Status: DC | PRN
  Administered 2011-11-19 (×2): 500 mL via INTRAVENOUS

## 2011-11-19 MED ORDER — MISOPROSTOL 25 MCG QUARTER TABLET
25.0000 ug | ORAL_TABLET | ORAL | Status: DC | PRN
  Filled 2011-11-19: qty 0.25

## 2011-11-19 MED ORDER — SERTRALINE HCL 50 MG PO TABS
50.0000 mg | ORAL_TABLET | Freq: Every day | ORAL | Status: DC
  Administered 2011-11-19: 50 mg via ORAL
  Filled 2011-11-19 (×2): qty 1

## 2011-11-19 MED ORDER — EPHEDRINE 5 MG/ML INJ: 10.0000 mg | INTRAVENOUS | Status: DC | PRN

## 2011-11-19 MED ORDER — OXYTOCIN BOLUS FROM INFUSION
500.0000 mL | Freq: Once | INTRAVENOUS | Status: DC
  Filled 2011-11-19: qty 500

## 2011-11-19 MED ORDER — DIPHENHYDRAMINE HCL 50 MG/ML IJ SOLN
12.5000 mg | INTRAMUSCULAR | Status: DC | PRN
  Administered 2011-11-19: 12.5 mg via INTRAVENOUS
  Filled 2011-11-19: qty 1

## 2011-11-19 MED ORDER — ONDANSETRON HCL 4 MG/2ML IJ SOLN
4.0000 mg | Freq: Four times a day (QID) | INTRAMUSCULAR | Status: DC | PRN
  Administered 2011-11-19 (×2): 4 mg via INTRAVENOUS
  Filled 2011-11-19 (×2): qty 2

## 2011-11-19 MED ORDER — FENTANYL 2.5 MCG/ML BUPIVACAINE 1/10 % EPIDURAL INFUSION (WH - ANES)
INTRAMUSCULAR | Status: DC | PRN
  Administered 2011-11-19: 12 mL/h via EPIDURAL

## 2011-11-19 NOTE — Progress Notes (Signed)
SAYS HURT BAD AT 0315.    NO VE IN OFFICE.  DENIES HSV AND MRSA.

## 2011-11-19 NOTE — Plan of Care (Signed)
Problem: Consults Goal: Birthing Suites Patient Information Press F2 to bring up selections list   Pt 37-[redacted] weeks EGA, Inpatient induction and PIH (Pregnancy induced hypertension)     

## 2011-11-19 NOTE — Progress Notes (Deleted)
Post Partum Day 2 Subjective: no complaints  Objective: Blood pressure 149/94, pulse 87, temperature 97.8 F (36.6 C), temperature source Oral, resp. rate 18, height 5\' 3"  (1.6 m), weight 101.606 kg (224 lb), last menstrual period 02/14/2011.  Physical Exam:  General: alert and no distress Lochia: appropriate Uterine Fundus: firm Incision: healing well DVT Evaluation: No evidence of DVT seen on physical exam.   Basename 11/19/11 0545  HGB 11.7*  HCT 35.6*    Assessment/Plan: Discharge home   LOS: 0 days   Treyana Sturgell A 11/19/2011, 9:17 AM

## 2011-11-19 NOTE — ED Notes (Signed)
Pt states GBS was collected, was not told results, nor mention of antibiotic use in labor

## 2011-11-19 NOTE — Progress Notes (Signed)
Cindy Gardner is a 26 y.o. [id number] at [redacted]w[redacted]d by LMP admitted for elevated blood pressure.  Subjective:   Objective: BP 162/94  Pulse 95  Temp(Src) 97.7 F (36.5 C) (Oral)  Resp 20  Ht 5\' 3"  (1.6 m)  Wt 101.606 kg (224 lb)  BMI 39.68 kg/m2  LMP 02/14/2011   Total I/O In: 263.8 [P.O.:120; I.V.:143.8] Out: 80 [Emesis/NG output:80]  FHT:  FHR: 150 bpm, variability: moderate,  accelerations:  Present,  decelerations:  Absent UC:   regular, every 4-6 minutes SVE:   Dilation: 4 Effacement (%): 80 Station: -2 Exam by:: [email or web address]  Labs: Lab Results  Component Value Date   WBC 11.2* 11/19/2011   HGB 11.7* 11/19/2011   HCT 35.6* 11/19/2011   MCV 85.6 11/19/2011   PLT 124* 11/19/2011    Assessment / Plan: Spontaneous labor, progressing normally  Labor: Progressing normally Preeclampsia:  on magnesium sulfate Fetal Wellbeing:  Category I Pain Control:  Percocet I/D:  n/a Anticipated MOD:  NSVD  Cindy Gardner A 11/19/2011, 10:38 AM

## 2011-11-19 NOTE — Progress Notes (Signed)
Patient ID: Cindy Gardner, female   DOB: Jun 28, 1986, 26 y.o.   MRN: [id number]  No change in her pelvic exam since her last exam cervix 5 cm with a vertex floating patient has been in adequate labor she delivered by C-section failure to progress in labor

## 2011-11-19 NOTE — Anesthesia Preprocedure Evaluation (Signed)
Anesthesia Evaluation  Patient identified by MRN, date of birth, ID band Patient awake    Reviewed: Allergy & Precautions, H&P , NPO status , Patient's Chart, lab work & pertinent test results  Airway Mallampati: I TM Distance: >3 FB Neck ROM: full    Dental  (+) Teeth Intact   Pulmonary neg pulmonary ROS,    Pulmonary exam normal       Cardiovascular hypertension, Pt. on medications     Neuro/Psych PSYCHIATRIC DISORDERS Depression Negative Neurological ROS     GI/Hepatic negative GI ROS, Neg liver ROS,   Endo/Other  Morbid obesity  Renal/GU negative Renal ROS  Genitourinary negative   Musculoskeletal negative musculoskeletal ROS (+)   Abdominal (+) obese,   Peds negative pediatric ROS (+)  Hematology negative hematology ROS (+)   Anesthesia Other Findings   Reproductive/Obstetrics negative OB ROS                           Anesthesia Physical Anesthesia Plan  ASA: III  Anesthesia Plan: Epidural   Post-op Pain Management:    Induction:   Airway Management Planned:   Additional Equipment:   Intra-op Plan:   Post-operative Plan:   Informed Consent: I have reviewed the patients History and Physical, chart, labs and discussed the procedure including the risks, benefits and alternatives for the proposed anesthesia with the patient or authorized representative who has indicated his/her understanding and acceptance.     Plan Discussed with: CRNA  Anesthesia Plan Comments:         Anesthesia Quick Evaluation

## 2011-11-19 NOTE — H&P (Signed)
Cindy Gardner is a 26 y.o. female presenting for back pain. Maternal Medical History:  Reason for admission: IOL secondary to PIH  Contractions: Frequency: irregular.    Fetal activity: Perceived fetal activity is normal.    Prenatal complications: Substance abuse.     OB History    Grav Para Term Preterm Abortions TAB SAB Ect Mult Living   5 4 1 3      4      Past Medical History  Diagnosis Date  . Pregnancy induced hypertension   . Depression    Past Surgical History  Procedure Date  . No past surgeries    Family History: family history is negative for Anesthesia problems. Social History:  reports that she has been smoking Cigarettes.  She has been smoking about 1 pack per day. She does not have any smokeless tobacco history on file. She reports that she uses illicit drugs (Marijuana). She reports that she does not drink alcohol.  Review of Systems  Constitutional: Negative for fever.  Eyes: Negative for blurred vision.  Respiratory: Negative for shortness of breath.   Gastrointestinal: Negative for vomiting.  Skin: Negative for rash.  Neurological: Negative for headaches.    Dilation: Fingertip Effacement (%): 50 Station: -3 Exam by:: Weston,RN Blood pressure 141/96, pulse 92, temperature 98.3 F (36.8 C), temperature source Oral, resp. rate 20, height 5\' 3"  (1.6 m), weight 101.606 kg (224 lb), last menstrual period 02/14/2011. Maternal Exam:  Uterine Assessment: Contraction strength is mild.  Contraction frequency is irregular.   Abdomen: Patient reports no abdominal tenderness. Fetal presentation: vertex  Introitus: not evaluated.     Fetal Exam Fetal Monitor Review: Variability: moderate (6-25 bpm).   Pattern: accelerations present and no decelerations.    Fetal State Assessment: Category I - tracings are normal.     Physical Exam  Constitutional: She appears well-developed.  HENT:  Head: Normocephalic.  Neck: Neck supple. No thyromegaly  present.  Cardiovascular: Normal rate and regular rhythm.   Respiratory: Breath sounds normal.  GI: Soft. Bowel sounds are normal.  Skin: No rash noted.    Prenatal labs: ABO, Rh:   Antibody:   Rubella:   RPR:    HBsAg:    HIV:    GBS:     Assessment/Plan: 26 y.o. w/an IUP @ [redacted]w[redacted]d.  Prodromal labor.  Mild PIH.  Admit IOL Monitor for signs/ symptoms of severe PIH  [name],[name] A 11/19/2011, 7:52 AM

## 2011-11-19 NOTE — ED Provider Notes (Signed)
History     Chief Complaint  Patient presents with  . Back Pain   HPI 26 y.o. [id number] at [redacted]w[redacted]d here with contractions, BP noted to be elevated during labor check. Pt reports contractions and constant low back pain tonight. Denies headache, vision changes, RUQ/epigastric pain. States she had some elevated BPs in the office prior to this.     Past Medical History  Diagnosis Date  . Pregnancy induced hypertension   . Depression     History reviewed. No pertinent past surgical history.  History reviewed. No pertinent family history.  History  Substance Use Topics  . Smoking status: Current Everyday Smoker -- 1.0 packs/day    Types: Cigarettes  . Smokeless tobacco: Not on file  . Alcohol Use: No    Allergies: No Known Allergies  Prescriptions prior to admission  Medication Sig Dispense Refill  . acetaminophen (TYLENOL) 500 MG tablet Take 500 mg by mouth every 6 (six) hours as needed. For pain      . Prenatal Vit-Fe Fumarate-FA (PRENATAL MULTIVITAMIN) TABS Take 1 tablet by mouth daily.      . sertraline (ZOLOFT) 50 MG tablet Take 50 mg by mouth daily.      Marland Kitchen zolpidem (AMBIEN) 10 MG tablet Take 10 mg by mouth at bedtime as needed. For insomnia        Review of Systems  Constitutional: Negative.   Respiratory: Negative.   Cardiovascular: Negative.   Gastrointestinal: Negative for nausea, vomiting, abdominal pain, diarrhea and constipation.  Genitourinary: Negative for dysuria, urgency, frequency, hematuria and flank pain.       Negative for vaginal bleeding, cramping/contractions  Musculoskeletal: Negative.   Neurological: Negative.   Psychiatric/Behavioral: Negative.    Physical Exam   Blood pressure 146/88, pulse 95, temperature 98.3 F (36.8 C), temperature source Oral, resp. rate 18, height 5\' 3"  (1.6 m), weight 224 lb (101.606 kg), last menstrual period 02/14/2011.  Physical Exam  Nursing note and vitals reviewed. Constitutional: She is oriented to person,  place, and time. She appears well-developed and well-nourished. No distress.  Cardiovascular: Normal rate.   Respiratory: Effort normal.  GI: Soft. There is no tenderness.  Musculoskeletal: Normal range of motion.  Neurological: She is alert and oriented to person, place, and time. She has normal reflexes.  Skin: Skin is warm and dry.  Psychiatric: She has a normal mood and affect.   EFM: 140s, mod variability, + accels; TOCO: UC q 2-6 min MAU Course  Procedures  Results for orders placed during the hospital encounter of 11/19/11 (from the past 24 hour(s))  URINALYSIS, ROUTINE W REFLEX MICROSCOPIC     Status: Abnormal   Collection Time   11/19/11  5:25 AM      Component Value Range   Color, Urine YELLOW  YELLOW    APPearance HAZY (*) CLEAR    Specific Gravity, Urine 1.025  1.005 - 1.030    pH 6.5  5.0 - 8.0    Glucose, UA NEGATIVE  NEGATIVE (mg/dL)   Hgb urine dipstick NEGATIVE  NEGATIVE    Bilirubin Urine NEGATIVE  NEGATIVE    Ketones, ur NEGATIVE  NEGATIVE (mg/dL)   Protein, ur NEGATIVE  NEGATIVE (mg/dL)   Urobilinogen, UA 0.2  0.0 - 1.0 (mg/dL)   Nitrite NEGATIVE  NEGATIVE    Leukocytes, UA NEGATIVE  NEGATIVE   CBC     Status: Abnormal   Collection Time   11/19/11  5:45 AM      Component Value Range  WBC 11.2 (*) 4.0 - 10.5 (K/uL)   RBC 4.16  3.87 - 5.11 (MIL/uL)   Hemoglobin 11.7 (*) 12.0 - 15.0 (g/dL)   HCT 96.0 (*) 45.4 - 46.0 (%)   MCV 85.6  78.0 - 100.0 (fL)   MCH 28.1  26.0 - 34.0 (pg)   MCHC 32.9  30.0 - 36.0 (g/dL)   RDW 09.8  11.9 - 14.7 (%)   Platelets 124 (*) 150 - 400 (K/uL)  COMPREHENSIVE METABOLIC PANEL     Status: Abnormal   Collection Time   11/19/11  5:45 AM      Component Value Range   Sodium 134 (*) 135 - 145 (mEq/L)   Potassium 4.1  3.5 - 5.1 (mEq/L)   Chloride 104  96 - 112 (mEq/L)   CO2 22  19 - 32 (mEq/L)   Glucose, Bld 91  70 - 99 (mg/dL)   BUN 9  6 - 23 (mg/dL)   Creatinine, Ser 8.29  0.50 - 1.10 (mg/dL)   Calcium 9.4  8.4 - 56.2 (mg/dL)    Total Protein 6.3  6.0 - 8.3 (g/dL)   Albumin 2.9 (*) 3.5 - 5.2 (g/dL)   AST 16  0 - 37 (U/L)   ALT 8  0 - 35 (U/L)   Alkaline Phosphatase 168 (*) 39 - 117 (U/L)   Total Bilirubin 0.4  0.3 - 1.2 (mg/dL)   GFR calc non Af Amer >90  >90 (mL/min)   GFR calc Af Amer >90  >90 (mL/min)  URIC ACID     Status: Normal   Collection Time   11/19/11  5:45 AM      Component Value Range   Uric Acid, Serum 6.0  2.4 - 7.0 (mg/dL)  LACTATE DEHYDROGENASE     Status: Normal   Collection Time   11/19/11  5:45 AM      Component Value Range   LD 140  94 - 250 (U/L)     Assessment and Plan  26 y.o. [id number] at [redacted]w[redacted]d [facility] - plan to admit  Dr. Tamela Oddi to put in orders  [facility] 11/19/2011, 6:56 AM

## 2011-11-19 NOTE — Anesthesia Procedure Notes (Signed)
Epidural Patient location during procedure: OB Start time: 11/19/2011 3:25 PM End time: 11/19/2011 3:34 PM Reason for block: procedure for pain  Staffing Anesthesiologist: Sandrea Hughs Performed by: anesthesiologist   Preanesthetic Checklist Completed: patient identified, site marked, surgical consent, pre-op evaluation, timeout performed, IV checked, risks and benefits discussed and monitors and equipment checked  Epidural Patient position: sitting Prep: site prepped and draped and DuraPrep Patient monitoring: continuous pulse ox and blood pressure Approach: midline Injection technique: LOR air  Needle:  Needle type: Tuohy  Needle gauge: 17 G Needle length: 9 cm Needle insertion depth: 7 cm Catheter type: closed end flexible Catheter size: 19 Gauge Catheter at skin depth: 12 cm Test dose: negative and 1.5% lidocaine  Assessment Sensory level: T10 Events: blood not aspirated, injection not painful, no injection resistance, negative IV test and no paresthesia

## 2011-11-20 ENCOUNTER — Other Ambulatory Visit: Payer: Self-pay | Admitting: Obstetrics

## 2011-11-20 ENCOUNTER — Inpatient Hospital Stay (HOSPITAL_COMMUNITY): Payer: Medicaid Other | Admitting: Anesthesiology

## 2011-11-20 ENCOUNTER — Encounter (HOSPITAL_COMMUNITY)
Admission: AD | Disposition: A | Discharge status: Home / Self Care | Payer: Self-pay | Source: Ambulatory Visit | Attending: Obstetrics & Gynecology

## 2011-11-20 ENCOUNTER — Encounter (HOSPITAL_COMMUNITY): Payer: Self-pay | Admitting: Anesthesiology

## 2011-11-20 ENCOUNTER — Encounter (HOSPITAL_COMMUNITY): Payer: Self-pay | Admitting: Obstetrics

## 2011-11-20 LAB — MAGNESIUM: Magnesium: 4 mg/dL — ABNORMAL HIGH (ref 1.5–2.5)

## 2011-11-20 LAB — CBC
HCT: 32.5 % — ABNORMAL LOW (ref 36.0–46.0)
Hemoglobin: 11.2 g/dL — ABNORMAL LOW (ref 12.0–15.0)
MCH: 28.6 pg (ref 26.0–34.0)
MCH: 28.2 pg (ref 26.0–34.0)
MCHC: 33.1 g/dL (ref 30.0–36.0)
MCV: 85.8 fL (ref 78.0–100.0)
Platelets: 121 10*3/uL — ABNORMAL LOW (ref 150–400)
RBC: 3.79 MIL/uL — ABNORMAL LOW (ref 3.87–5.11)
RDW: 13.9 % (ref 11.5–15.5)
WBC: 11.2 10*3/uL — ABNORMAL HIGH (ref 4.0–10.5)

## 2011-11-20 SURGERY — Surgical Case
Anesthesia: Regional

## 2011-11-20 MED ORDER — OXYTOCIN 10 UNIT/ML IJ SOLN
INTRAMUSCULAR | Status: AC
Start: 2011-11-20 — End: 2011-11-20
  Filled 2011-11-20: qty 4

## 2011-11-20 MED ORDER — MORPHINE SULFATE (PF) 0.5 MG/ML IJ SOLN
INTRAMUSCULAR | Status: DC | PRN
  Administered 2011-11-20: 4 mg via EPIDURAL

## 2011-11-20 MED ORDER — FENTANYL CITRATE 0.05 MG/ML IJ SOLN
25.0000 ug | INTRAMUSCULAR | Status: DC | PRN
  Administered 2011-11-20 (×3): 50 ug via INTRAVENOUS

## 2011-11-20 MED ORDER — DIPHENHYDRAMINE HCL 50 MG/ML IJ SOLN: 12.5000 mg | INTRAMUSCULAR | Status: DC | PRN

## 2011-11-20 MED ORDER — SODIUM BICARBONATE 8.4 % IV SOLN
INTRAVENOUS | Status: DC | PRN
  Administered 2011-11-20: 5 mL via EPIDURAL

## 2011-11-20 MED ORDER — DIPHENHYDRAMINE HCL 50 MG/ML IJ SOLN: 25.0000 mg | INTRAMUSCULAR | Status: DC | PRN

## 2011-11-20 MED ORDER — CEFAZOLIN SODIUM 1-5 GM-% IV SOLN: 1.0000 g | INTRAVENOUS | Status: DC

## 2011-11-20 MED ORDER — TETANUS-DIPHTH-ACELL PERTUSSIS 5-2.5-18.5 LF-MCG/0.5 IM SUSP
0.5000 mL | Freq: Once | INTRAMUSCULAR | Status: AC
  Administered 2011-11-21: 0.5 mL via INTRAMUSCULAR
  Filled 2011-11-20: qty 0.5

## 2011-11-20 MED ORDER — LIDOCAINE-EPINEPHRINE (PF) 2 %-1:200000 IJ SOLN
INTRAMUSCULAR | Status: AC
  Filled 2011-11-20: qty 20

## 2011-11-20 MED ORDER — SODIUM BICARBONATE 8.4 % IV SOLN
INTRAVENOUS | Status: AC
  Filled 2011-11-20: qty 50

## 2011-11-20 MED ORDER — LACTATED RINGERS IV SOLN
INTRAVENOUS | Status: DC
  Administered 2011-11-20: 14:00:00 via INTRAVENOUS

## 2011-11-20 MED ORDER — MEPERIDINE HCL 25 MG/ML IJ SOLN: 6.2500 mg | INTRAMUSCULAR | Status: DC | PRN

## 2011-11-20 MED ORDER — ONDANSETRON HCL 4 MG/2ML IJ SOLN
INTRAMUSCULAR | Status: DC | PRN
  Administered 2011-11-20: 4 mg via INTRAVENOUS

## 2011-11-20 MED ORDER — FENTANYL CITRATE 0.05 MG/ML IJ SOLN
INTRAMUSCULAR | Status: AC
  Filled 2011-11-20: qty 2

## 2011-11-20 MED ORDER — SODIUM CHLORIDE 0.9 % IV SOLN
1.0000 ug/kg/h | INTRAVENOUS | Status: DC | PRN
  Filled 2011-11-20: qty 2.5

## 2011-11-20 MED ORDER — IBUPROFEN 600 MG PO TABS: 600.0000 mg | ORAL_TABLET | Freq: Four times a day (QID) | ORAL | Status: DC | PRN

## 2011-11-20 MED ORDER — PHENYLEPHRINE HCL 10 MG/ML IJ SOLN
INTRAMUSCULAR | Status: DC | PRN
  Administered 2011-11-20 (×2): 40 ug via INTRAVENOUS
  Administered 2011-11-20 (×3): 80 ug via INTRAVENOUS

## 2011-11-20 MED ORDER — ZOLPIDEM TARTRATE 5 MG PO TABS
5.0000 mg | ORAL_TABLET | Freq: Every evening | ORAL | Status: DC | PRN
  Administered 2011-11-21 – 2011-11-23 (×2): 5 mg via ORAL
  Filled 2011-11-20 (×2): qty 1

## 2011-11-20 MED ORDER — WITCH HAZEL-GLYCERIN EX PADS: 1.0000 "application " | MEDICATED_PAD | CUTANEOUS | Status: DC | PRN

## 2011-11-20 MED ORDER — METOCLOPRAMIDE HCL 5 MG/ML IJ SOLN: 10.0000 mg | Freq: Three times a day (TID) | INTRAMUSCULAR | Status: DC | PRN

## 2011-11-20 MED ORDER — SIMETHICONE 80 MG PO CHEW
80.0000 mg | CHEWABLE_TABLET | Freq: Three times a day (TID) | ORAL | Status: DC
  Administered 2011-11-20 – 2011-11-23 (×12): 80 mg via ORAL

## 2011-11-20 MED ORDER — SIMETHICONE 80 MG PO CHEW: 80.0000 mg | CHEWABLE_TABLET | ORAL | Status: DC | PRN

## 2011-11-20 MED ORDER — KETOROLAC TROMETHAMINE 30 MG/ML IJ SOLN: 30.0000 mg | Freq: Four times a day (QID) | INTRAMUSCULAR | Status: DC | PRN

## 2011-11-20 MED ORDER — SENNOSIDES-DOCUSATE SODIUM 8.6-50 MG PO TABS
2.0000 | ORAL_TABLET | Freq: Every day | ORAL | Status: DC
  Administered 2011-11-20 – 2011-11-22 (×3): 2 via ORAL

## 2011-11-20 MED ORDER — ONDANSETRON HCL 4 MG PO TABS: 4.0000 mg | ORAL_TABLET | ORAL | Status: DC | PRN

## 2011-11-20 MED ORDER — KETOROLAC TROMETHAMINE 60 MG/2ML IM SOLN: 60.0000 mg | Freq: Once | INTRAMUSCULAR | Status: DC | PRN

## 2011-11-20 MED ORDER — ONDANSETRON HCL 4 MG/2ML IJ SOLN
INTRAMUSCULAR | Status: AC
  Filled 2011-11-20: qty 2

## 2011-11-20 MED ORDER — PHENYLEPHRINE 40 MCG/ML (10ML) SYRINGE FOR IV PUSH (FOR BLOOD PRESSURE SUPPORT)
PREFILLED_SYRINGE | INTRAVENOUS | Status: AC
  Filled 2011-11-20: qty 10

## 2011-11-20 MED ORDER — SCOPOLAMINE 1 MG/3DAYS TD PT72: 1.0000 | MEDICATED_PATCH | Freq: Once | TRANSDERMAL | Status: DC

## 2011-11-20 MED ORDER — LANOLIN HYDROUS EX OINT: 1.0000 "application " | TOPICAL_OINTMENT | CUTANEOUS | Status: DC | PRN

## 2011-11-20 MED ORDER — NALBUPHINE SYRINGE 5 MG/0.5 ML
5.0000 mg | INJECTION | INTRAMUSCULAR | Status: DC | PRN
  Filled 2011-11-20: qty 1

## 2011-11-20 MED ORDER — MENTHOL 3 MG MT LOZG: 1.0000 | LOZENGE | OROMUCOSAL | Status: DC | PRN

## 2011-11-20 MED ORDER — PRENATAL MULTIVITAMIN CH
1.0000 | ORAL_TABLET | Freq: Every day | ORAL | Status: DC
  Administered 2011-11-20 – 2011-11-23 (×4): 1 via ORAL
  Filled 2011-11-20 (×4): qty 1

## 2011-11-20 MED ORDER — SCOPOLAMINE 1 MG/3DAYS TD PT72
MEDICATED_PATCH | TRANSDERMAL | Status: AC
  Administered 2011-11-20: 1.5 mg via TRANSDERMAL
  Filled 2011-11-20: qty 1

## 2011-11-20 MED ORDER — OXYCODONE-ACETAMINOPHEN 5-325 MG PO TABS
1.0000 | ORAL_TABLET | ORAL | Status: DC | PRN
  Administered 2011-11-20 – 2011-11-21 (×3): 1 via ORAL
  Administered 2011-11-21: 2 via ORAL
  Administered 2011-11-21 (×2): 1 via ORAL
  Administered 2011-11-21 (×2): 2 via ORAL
  Administered 2011-11-22 (×2): 1 via ORAL
  Administered 2011-11-22: 2 via ORAL
  Administered 2011-11-23: 1 via ORAL
  Administered 2011-11-23: 2 via ORAL
  Filled 2011-11-20: qty 1
  Filled 2011-11-20 (×2): qty 2
  Filled 2011-11-20 (×2): qty 1
  Filled 2011-11-20: qty 2
  Filled 2011-11-20 (×4): qty 1
  Filled 2011-11-20 (×2): qty 2
  Filled 2011-11-20: qty 1

## 2011-11-20 MED ORDER — DIPHENHYDRAMINE HCL 25 MG PO CAPS
25.0000 mg | ORAL_CAPSULE | ORAL | Status: DC | PRN
  Filled 2011-11-20 (×2): qty 1

## 2011-11-20 MED ORDER — DIPHENHYDRAMINE HCL 25 MG PO CAPS
25.0000 mg | ORAL_CAPSULE | Freq: Four times a day (QID) | ORAL | Status: DC | PRN
  Administered 2011-11-20 (×3): 25 mg via ORAL
  Filled 2011-11-20 (×2): qty 1

## 2011-11-20 MED ORDER — SODIUM CHLORIDE 0.9 % IJ SOLN: 3.0000 mL | INTRAMUSCULAR | Status: DC | PRN

## 2011-11-20 MED ORDER — MORPHINE SULFATE 0.5 MG/ML IJ SOLN
INTRAMUSCULAR | Status: AC
  Filled 2011-11-20: qty 10

## 2011-11-20 MED ORDER — OXYTOCIN 20 UNITS IN LACTATED RINGERS INFUSION - SIMPLE
125.0000 mL/h | INTRAVENOUS | Status: AC
  Administered 2011-11-20: 125 mL/h via INTRAVENOUS
  Filled 2011-11-20: qty 1000

## 2011-11-20 MED ORDER — DIBUCAINE 1 % RE OINT: 1.0000 "application " | TOPICAL_OINTMENT | RECTAL | Status: DC | PRN

## 2011-11-20 MED ORDER — OXYTOCIN 20 UNITS IN LACTATED RINGERS INFUSION - SIMPLE
INTRAVENOUS | Status: DC | PRN
  Administered 2011-11-20 (×2): 20 [IU] via INTRAVENOUS

## 2011-11-20 MED ORDER — NALOXONE HCL 0.4 MG/ML IJ SOLN: 0.4000 mg | INTRAMUSCULAR | Status: DC | PRN

## 2011-11-20 MED ORDER — ONDANSETRON HCL 4 MG/2ML IJ SOLN: 4.0000 mg | Freq: Three times a day (TID) | INTRAMUSCULAR | Status: DC | PRN

## 2011-11-20 MED ORDER — IBUPROFEN 600 MG PO TABS
600.0000 mg | ORAL_TABLET | Freq: Four times a day (QID) | ORAL | Status: DC
  Administered 2011-11-20 – 2011-11-23 (×12): 600 mg via ORAL
  Filled 2011-11-20 (×12): qty 1

## 2011-11-20 MED ORDER — ONDANSETRON HCL 4 MG/2ML IJ SOLN: 4.0000 mg | INTRAMUSCULAR | Status: DC | PRN

## 2011-11-20 SURGICAL SUPPLY — 31 items
ADH SKN CLS APL DERMABOND .7 (GAUZE/BANDAGES/DRESSINGS) ×1
CHLORAPREP W/TINT 26ML (MISCELLANEOUS) ×2 IMPLANT
CLOTH BEACON ORANGE TIMEOUT ST (SAFETY) ×2 IMPLANT
DERMABOND ADVANCED (GAUZE/BANDAGES/DRESSINGS) ×1
DERMABOND ADVANCED .7 DNX12 (GAUZE/BANDAGES/DRESSINGS) ×1 IMPLANT
DRSG COVADERM 4X10 (GAUZE/BANDAGES/DRESSINGS) ×1 IMPLANT
ELECT REM PT RETURN 9FT ADLT (ELECTROSURGICAL) ×2
ELECTRODE REM PT RTRN 9FT ADLT (ELECTROSURGICAL) ×1 IMPLANT
EXTRACTOR VACUUM M CUP 4 TUBE (SUCTIONS) IMPLANT
GLOVE BIO SURGEON STRL SZ8.5 (GLOVE) ×4 IMPLANT
GLOVE SURG SS PI 7.5 STRL IVOR (GLOVE) ×4 IMPLANT
GOWN PREVENTION PLUS LG XLONG (DISPOSABLE) ×4 IMPLANT
GOWN PREVENTION PLUS XXLARGE (GOWN DISPOSABLE) ×2 IMPLANT
KIT ABG SYR 3ML LUER SLIP (SYRINGE) IMPLANT
NDL HYPO 25X5/8 SAFETYGLIDE (NEEDLE) ×1 IMPLANT
NEEDLE HYPO 25X5/8 SAFETYGLIDE (NEEDLE) ×2 IMPLANT
NS IRRIG 1000ML POUR BTL (IV SOLUTION) ×2 IMPLANT
PACK C SECTION WH (CUSTOM PROCEDURE TRAY) ×2 IMPLANT
SLEEVE SCD COMPRESS KNEE MED (MISCELLANEOUS) IMPLANT
SUT CHROMIC 0 CT 802H (SUTURE) ×2 IMPLANT
SUT CHROMIC 1 CTX 36 (SUTURE) ×4 IMPLANT
SUT CHROMIC 2 0 SH (SUTURE) ×2 IMPLANT
SUT GUT PLAIN 0 CT-3 TAN 27 (SUTURE) IMPLANT
SUT MON AB 4-0 PS1 27 (SUTURE) ×2 IMPLANT
SUT VIC AB 0 CT1 18XCR BRD8 (SUTURE) IMPLANT
SUT VIC AB 0 CT1 8-18 (SUTURE)
SUT VIC AB 0 CTX 36 (SUTURE) ×4
SUT VIC AB 0 CTX36XBRD ANBCTRL (SUTURE) ×2 IMPLANT
TOWEL OR 17X24 6PK STRL BLUE (TOWEL DISPOSABLE) ×4 IMPLANT
TRAY FOLEY CATH 14FR (SET/KITS/TRAYS/PACK) ×2 IMPLANT
WATER STERILE IRR 1000ML POUR (IV SOLUTION) ×2 IMPLANT

## 2011-11-20 NOTE — Anesthesia Postprocedure Evaluation (Addendum)
Anesthesia Post Note  Patient: Cindy Gardner  Procedure(s) Performed:  CESAREAN SECTION  Anesthesia type: Epidural  Patient location: PACU  Post pain: Pain level controlled  Post assessment: Post-op Vital signs reviewed  Last Vitals:  Filed Vitals:   11/20/11 0315  BP: 131/64  Pulse: 102  Temp:   Resp: 17    Post vital signs: stable  Level of consciousness: awake  Complications: No apparent anesthesia complications

## 2011-11-20 NOTE — Consult Note (Signed)
Neonatology Note:  Attendance at C-section:  I was asked to attend this primary C/S at term due to FTP. The mother is a G5P4 A pos, GBS neg smoker with a history of anxiety and depression. ROM at delivery, fluid clear. Infant vigorous with good spontaneous cry and tone. Needed only bulb suctioning. Ap 8/9. Lungs clear to ausc in DR. To CN to care of Pediatrician.  Cahlil Sattar, MD  

## 2011-11-20 NOTE — Addendum Note (Signed)
Addendum  created 11/20/11 0855 by Fanny Dance   Modules edited:Notes Section

## 2011-11-20 NOTE — Transfer of Care (Signed)
Immediate Anesthesia Transfer of Care Note  Patient: Cindy Gardner  Procedure(s) Performed:  CESAREAN SECTION  Patient Location: PACU  Anesthesia Type: Epidural  Level of Consciousness: sedated, patient cooperative and lethargic  Airway & Oxygen Therapy: Patient Spontanous Breathing and Patient connected to nasal cannula oxygen  Post-op Assessment: Report given to PACU RN and Post -op Vital signs reviewed and stable  Post vital signs: Reviewed and stable Filed Vitals:   11/20/11 0032  BP: 140/81  Pulse: 98  Temp:   Resp: 18    Complications: No apparent anesthesia complications

## 2011-11-20 NOTE — Op Note (Signed)
Diagnosis to progress in labor and PIH postop back Postop diagnosis the same Surgeon Dr. Francoise Ceo Anesthesia epidural Acedia patient placed an upper and in in the supine position abdomen prepped and draped data and to the Foley catheter a transverse suprapubic incision made carried down to the rectus fascia fascia cleaned and incised the length of the incision recti muscles retracted laterally peritoneum incised longitudinally transverse incision made in the visceroperitoneum above the bladder bladder mobilized inferiorly transverse low uterine incision made the patient delivered from the OP position of a female Apgar in in 9 and 9 weight unknown the placenta was fundal removed manually and sent to pathology uterine cavity clean with dry laps uterine incision closed in one layer with continuous within normal on chromic hemostasis satisfactory bladder flap reattached to a chromic uterus well contracted tubes and ovaries normal abdomen closed in layers peritoneum continuous with 2-0 chromic fascia continuous with 2-0 Dexon and the skin closed with staples the Lasix 100 cc patient tolerated the procedure well taken to recovery room in good condition end of dictation dictated by Dr. Gaynell Face on 1213 thank you

## 2011-11-20 NOTE — Anesthesia Postprocedure Evaluation (Signed)
  Anesthesia Post-op Note  Patient: Cindy Gardner  Procedure(s) Performed:  CESAREAN SECTION  Patient Location: 319  Anesthesia Type: Epidural  Level of Consciousness: alert  and oriented  Airway and Oxygen Therapy: Patient Spontanous Breathing  Post-op Pain: mild  Post-op Assessment: Patient's Cardiovascular Status Stable and Respiratory Function Stable  Post-op Vital Signs: stable  Complications: No apparent anesthesia complications

## 2011-11-21 MED ORDER — SERTRALINE HCL 50 MG PO TABS
50.0000 mg | ORAL_TABLET | Freq: Every day | ORAL | Status: DC
  Administered 2011-11-21 – 2011-11-23 (×3): 50 mg via ORAL
  Filled 2011-11-21 (×3): qty 1

## 2011-11-21 NOTE — Progress Notes (Signed)
Sleeping soundly. V/S deferred to allow pt rest.  Notified Joycelyn Man, RN

## 2011-11-21 NOTE — Progress Notes (Signed)
LCSW consult with bedside RN to assess patient status.  Reports Pt doing well.  Zoloft started today due to ongoing periods of crying.  MOB has visitors in the room at this time and will attempt visit another time.   Staci Acosta, LCSW 11/21/11, 3:43, pm

## 2011-11-21 NOTE — Progress Notes (Signed)
PSYCHOSOCIAL ASSESSMENT ~ MATERNAL/CHILD Name: Cindy Gardner         Age: 27 day    Referral Date: 11/20/11   Reason/Source:Hx of MJ use, anxiety, depression/CN I. FAMILY/HOME ENVIRONMENT A. Child's Legal Guardian Parent:  Name: Cindy Gardner  DOB: 1985/11/27    Age:67 Address: [address] [place], [place], [place], [address] Name: Cindy Gardner  B. Other Household Members/Support Persons Name: Cindy Gardner- close family friend           Name: 37 year old sister   C.   Other Support: extended family and friends  II. PSYCHOSOCIAL DATA A. Information Source X Patient Interview    B. Financial and [employer] X Medicaid- [place] [employer] Stamps      X WIC   C. Cultural and Environment Information/Cultural Issues Impacting Care: N/A III. STRENGTHS X Supportive family/friends   X Adequate Resources  X Compliance with medical plan  X Home prepared for Child (including basic supplies)                  IV. RISK FACTORS AND CURRENT PROBLEMS         X Substance Abuse        X Mental Illness              V. SOCIAL WORK ASSESSMENT LCSW met with MOB at bedside to assess current strengths, needs and resources.  MOB lives with her two year old daughter and a close family friend and support person, Ms. Chase.  MOB has lived with Ms. Chase for the past 3 years.  She reports Ms. Chase "spoils" her because she has always been there for MOB.  MOB reported a significant early life history that included family disruption, running away and having 3 children by the time she was 59 years old.  The father of her first three children died, and the paternal grandmother of those 3 children took those children into her care.  MOB has maintained a relationship with her first three children and they spend time together regularly.  MOB reports she was not prepared at 16 to raise three children and was grateful that they have been cared for and continue to be involved in her life.    MOB  admits to having used substances.  She reports not having used Marijuana for at least a few months and her UDS was negative.  She reports that when her two year old was born, the baby's UDS was postive and CPS came to the house, assessed the living environment and did not come back out.  MOB reports she never smokes around her children.  She reports that she began taking Zoloft and was very pleased for the help it has provided her.  MOB reports the Zoloft helps her feel much better, and quiets her craving for Marijuana.  MOB was very thankful that the hospital restarted her Zoloft today.  She reports it is a very useful tool for improved well being.  MOB reports that the FOB also has a 27 year old and 5 month who, that was conceived prior to MOB getting in a relationship with him.  FOB is pursuing DNA testing for the 33 month old.  MOB has known FOB since they were kids, and she describes him as her best friend.  FOB works for his uncle and has job flexibility.  He has been in the hospital with MOB during her stay.  She admits  to having been stressed at the beginning of her pregnancy due to FOB having been in jail for a while due to a parole violation, but she feels things have settled down now.  MOB feels good about her ability to care for her babies.  She regards her baby's lovingly. She has had lots of support present throughout her stay and calling to check on her.  MOB feels good about the support and love she has in her life.  She does not express any concerns or needs at this time.  She feels good about her ability to parent her children and does not forsee any roadblocks to being able to care for her children.  She eventually desires to go back to school someday to become a Charity fundraiser and work with the senior population.  MOB was pleasant, cooperative, communicative and forthcoming about her circumstances.  She is very resilient and has overcome great challenges in her life.  She continues to work towards positive  changes and has empowered herself to take care of her behavioral health needs.  Commended MOB for the positive changes she's made and for continuing to keep positive supports at close hand.  VI. SOCIAL WORK PLAN X No Further Intervention Required/No Barriers to Discharge  Staci Acosta, LCSW, 11/21/11, 8:11 pm

## 2011-11-21 NOTE — Progress Notes (Signed)
Patient ID: Cindy Gardner, female   DOB: 1985/11/22, 26 y.o.   MRN: [id number] Postop day 1 Vital signs normal Fundus firm No complaints Doing well

## 2011-11-22 ENCOUNTER — Encounter (HOSPITAL_COMMUNITY): Payer: Self-pay | Admitting: Obstetrics

## 2011-11-22 NOTE — Progress Notes (Signed)
Patient ID: Cindy Gardner, female   DOB: 07/15/1986, 26 y.o.   MRN: [id number] Subjective: POD# 2 s/p Cesarean Delivery.  Indications: failure to progress  RH status/Rubella reviewed. Feeding: unknown Patient reports tolerating PO.  Denies HA/SOB/C/P/N/V/dizziness.  Reports flatus or BM. Breast symptoms: no.  She reports vaginal bleeding as normal, without clots.  She is ambulating, urinating without difficulty.     Objective: Vital signs in last 24 hours: BP 115/80  Pulse 100  Temp(Src) 98.1 F (36.7 C) (Oral)  Resp 20  Ht 5\' 3"  (1.6 m)  Wt 101.606 kg (224 lb)  BMI 39.68 kg/m2  SpO2 100%  LMP 02/14/2011  Breastfeeding? Unknown       Physical Exam:  General: alert CV: Regular rate and rhythm Resp: clear Abdomen: soft, nontender, normal bowel sounds Lochia: minimal Uterine Fundus: firm, below umbilicus, nontender Incision: clean, dry and intact Ext: extremities normal, atraumatic, no cyanosis or edema    Basename 11/20/11 0530 11/20/11 0225  HGB 10.7* 11.2*  HCT 32.5* 33.8*      Assessment/Plan: 26 y.o.  status post Cesarean section. POD# 2.   Doing well, stable.               Ambulate IS Routine post-op care  [name],Emeri Estill A 11/22/2011, 8:13 AM

## 2011-11-23 MED ORDER — IBUPROFEN 600 MG PO TABS: 600.0000 mg | ORAL_TABLET | Freq: Four times a day (QID) | ORAL | Status: DC

## 2011-11-23 MED ORDER — OXYCODONE-ACETAMINOPHEN 5-325 MG PO TABS: 1.0000 | ORAL_TABLET | ORAL | Status: DC | PRN

## 2011-11-23 NOTE — Progress Notes (Signed)
Subjective: Postpartum Day 3: Cesarean Delivery Patient reports tolerating PO.    Objective: Vital signs in last 24 hours: Temp:  [98.3 F (36.8 C)-99.5 F (37.5 C)] 98.3 F (36.8 C) (01/15 0604) Pulse Rate:  [85-108] 85  (01/15 0604) Resp:  [18-20] 19  (01/15 0604) BP: (138-148)/(82-86) 148/82 mmHg (01/15 0604) SpO2:  [97 %-100 %] 99 % (01/14 2120)  Physical Exam:  General: alert and no distress Lochia: appropriate Uterine Fundus: firm Incision: healing well DVT Evaluation: No evidence of DVT seen on physical exam.  No results found for this basename: HGB:2,HCT:2 in the last 72 hours  Assessment/Plan: Status post Cesarean section. Doing well postoperatively.  Discharge home with standard precautions and return to clinic in 2 weeks.  HARPER,CHARLES A 11/23/2011, 8:23 AM

## 2011-11-23 NOTE — Progress Notes (Signed)
UR Chart review completed.  

## 2011-11-23 NOTE — Progress Notes (Signed)
Patient desires depo-provera prior to discharge.

## 2011-11-23 NOTE — Progress Notes (Signed)
Pt ambulate  Out  Teaching complete  Baby with  Pt

## 2011-11-23 NOTE — Discharge Summary (Signed)
Obstetric Discharge Summary Reason for Admission: onset of labor Prenatal Procedures: ultrasound Intrapartum Procedures: cesarean: low cervical, transverse Postpartum Procedures: none Complications-Operative and Postpartum: none Hemoglobin  Date Value Range Status  11/20/2011 10.7* 12.0-15.0 (g/dL) Final     HCT  Date Value Range Status  11/20/2011 32.5* 36.0-46.0 (%) Final    Discharge Diagnoses: Term Pregnancy-delivered  Discharge Information: Date: 11/23/2011 Activity: pelvic rest Diet: routine Medications: PNV, Ibuprofen, Colace and Percocet Condition: stable Instructions: refer to practice specific booklet Discharge to: home Follow-up Information    Follow up with [name], MD. Schedule an appointment as soon as possible for a visit in 2 weeks.   Contact information:   [address] [place] [address] [phone]          Newborn Data: Live born female  Birth Weight: 8 lb 4.5 oz (3755 g) APGAR: 8, 9  Home with mother.  [name] 11/23/2011, 8:32 AM

## 2012-09-10 ENCOUNTER — Encounter (HOSPITAL_COMMUNITY): Payer: Self-pay

## 2012-09-10 ENCOUNTER — Emergency Department (HOSPITAL_COMMUNITY)
Admission: EM | Admit: 2012-09-10 | Discharge: 2012-09-10 | Disposition: A | Discharge status: Home / Self Care | Payer: Medicaid Other | Attending: Emergency Medicine | Admitting: Emergency Medicine

## 2012-09-10 DIAGNOSIS — R300 Dysuria: Secondary | ICD-10-CM | POA: Insufficient documentation

## 2012-09-10 DIAGNOSIS — K2920 Alcoholic gastritis without bleeding: Secondary | ICD-10-CM

## 2012-09-10 DIAGNOSIS — R6883 Chills (without fever): Secondary | ICD-10-CM | POA: Insufficient documentation

## 2012-09-10 DIAGNOSIS — F17200 Nicotine dependence, unspecified, uncomplicated: Secondary | ICD-10-CM | POA: Insufficient documentation

## 2012-09-10 DIAGNOSIS — R112 Nausea with vomiting, unspecified: Secondary | ICD-10-CM | POA: Insufficient documentation

## 2012-09-10 DIAGNOSIS — Z8659 Personal history of other mental and behavioral disorders: Secondary | ICD-10-CM | POA: Insufficient documentation

## 2012-09-10 LAB — CBC WITH DIFFERENTIAL/PLATELET
Basophils Absolute: 0 10*3/uL (ref 0.0–0.1)
Eosinophils Relative: 0 % (ref 0–5)
HCT: 42.7 % (ref 36.0–46.0)
Lymphocytes Relative: 23 % (ref 12–46)
Lymphs Abs: 2 10*3/uL (ref 0.7–4.0)
MCV: 86.4 fL (ref 78.0–100.0)
Monocytes Absolute: 0.3 10*3/uL (ref 0.1–1.0)
Neutro Abs: 6.3 10*3/uL (ref 1.7–7.7)
Platelets: 224 10*3/uL (ref 150–400)
RBC: 4.94 MIL/uL (ref 3.87–5.11)
WBC: 8.6 10*3/uL (ref 4.0–10.5)

## 2012-09-10 LAB — BASIC METABOLIC PANEL
CO2: 22 mEq/L (ref 19–32)
Calcium: 9.9 mg/dL (ref 8.4–10.5)
Chloride: 102 mEq/L (ref 96–112)
Glucose, Bld: 106 mg/dL — ABNORMAL HIGH (ref 70–99)
Sodium: 138 mEq/L (ref 135–145)

## 2012-09-10 MED ORDER — SODIUM CHLORIDE 0.9 % IV SOLN: INTRAVENOUS | Status: DC

## 2012-09-10 MED ORDER — ONDANSETRON HCL 4 MG/2ML IJ SOLN
4.0000 mg | Freq: Once | INTRAMUSCULAR | Status: AC
  Administered 2012-09-10: 4 mg via INTRAVENOUS
  Filled 2012-09-10: qty 2

## 2012-09-10 MED ORDER — ONDANSETRON 8 MG PO TBDP: 8.0000 mg | ORAL_TABLET | Freq: Three times a day (TID) | ORAL | Status: DC | PRN

## 2012-09-10 MED ORDER — SODIUM CHLORIDE 0.9 % IV BOLUS (SEPSIS)
1000.0000 mL | Freq: Once | INTRAVENOUS | Status: AC
  Administered 2012-09-10: 1000 mL via INTRAVENOUS

## 2012-09-10 NOTE — ED Provider Notes (Signed)
History     CSN: [id number]  Arrival date & time 09/10/12  1300   First MD Initiated Contact with Patient 09/10/12 1329      Chief Complaint  Patient presents with  . Alcohol Intoxication  . Emesis    (Consider location/radiation/quality/duration/timing/severity/associated sxs/prior treatment) The history is provided by the patient.   patient was brought in by [facility]. 26 year old female mistreating heavily last night. Now is having abdominal discomfort active vomiting. States his abdominal discomfort is 10 out of 10. The abdominal pain is described as an ache nonradiating.  Past Medical History  Diagnosis Date  . Pregnancy induced hypertension   . Depression   . Anxiety     Past Surgical History  Procedure Date  . No past surgeries   . Cesarean section 11/20/2011    Procedure: CESAREAN SECTION;  Surgeon: Kathreen Cosier, MD;  Location: [facility];  Service: Gynecology;  Laterality: N/A;    Family History  Problem Relation Age of Onset  . Anesthesia problems Neg Hx     History  Substance Use Topics  . Smoking status: Current Every Day Smoker -- 1.0 packs/day    Types: Cigarettes  . Smokeless tobacco: Not on file  . Alcohol Use: Yes    OB History    Grav Para Term Preterm Abortions TAB SAB Ect Mult Living   5 5 2 3      5       Review of Systems  Constitutional: Positive for chills. Negative for fever and appetite change.  HENT: Negative for congestion.   Eyes: Negative for redness and visual disturbance.  Respiratory: Negative for shortness of breath.   Cardiovascular: Negative for chest pain.  Gastrointestinal: Positive for nausea, vomiting and abdominal pain. Negative for diarrhea and abdominal distention.  Genitourinary: Positive for dysuria.  Musculoskeletal: Negative for back pain.  Skin: Negative for rash.  Neurological: Negative for headaches.  Hematological: Does not bruise/bleed easily.    Allergies  Review of patient's allergies indicates no  known allergies.  Home Medications  No current outpatient prescriptions on file.  BP 122/73  Pulse 71  Temp 98.2 F (36.8 C) (Oral)  Resp 18  SpO2 100%  LMP 09/05/2012  Physical Exam  Nursing note and vitals reviewed. Constitutional: She appears well-developed and well-nourished.  HENT:  Head: Normocephalic and atraumatic.       Mucous membranes are dry  Eyes: Conjunctivae normal and EOM are normal. Pupils are equal, round, and reactive to light.  Neck: Normal range of motion. Neck supple.  Cardiovascular: Normal rate, regular rhythm, normal heart sounds and intact distal pulses.   No murmur heard. Pulmonary/Chest: Effort normal and breath sounds normal. No respiratory distress. She has no wheezes. She has no rales.  Abdominal: Soft. Bowel sounds are normal. There is no tenderness. There is no rebound and no guarding.  Musculoskeletal: Normal range of motion. She exhibits no tenderness.  Neurological: She is alert. No cranial nerve deficit. She exhibits normal muscle tone. Coordination normal.  Skin: Skin is warm. No rash noted.    ED Course  Procedures (including critical care time)   Labs Reviewed  CBC WITH DIFFERENTIAL  BASIC METABOLIC PANEL   No results found.   1. Alcoholic gastritis       MDM   Patient admits to drinking heavily last night. Appears dehydrated. His been vomiting multiple times since then a few streaks of blood but no pool of blood. Patient most likely is dehydrated will be treated with the  normal saline bolus of 1 L then 150 cc an hour. May require additional boluses. Anti-medics basic metabolic panel and CBC have been ordered. Patient most likely will be able to be discharged home once hydrated up and vomiting is under control. Patient is alert not acting intoxicated currently no reason to check an alcohol level at this time.  Patient planning of abdominal pain her abdomen is flat soft nontender.        Shelda Jakes, MD 09/10/12  [phone]

## 2012-09-10 NOTE — ED Notes (Signed)
Per PTAR, Pt had a significant amount of alcohol last night.  Reports abdominal pain and active vomiting.  Pain score 10/10.

## 2012-09-10 NOTE — ED Notes (Signed)
Bed:WA13<BR> Expected date:<BR> Expected time:<BR> Means of arrival:<BR> Comments:<BR> Triage 

## 2012-09-10 NOTE — ED Notes (Signed)
Pt sts severe abdominal pain and vomiting started this morning.  Sts "I drank "too much liquor last."  Pt is actively vomiting.  Pain score 10/10.

## 2013-08-28 ENCOUNTER — Ambulatory Visit: Payer: Self-pay | Admitting: Obstetrics

## 2014-01-09 ENCOUNTER — Encounter: Payer: Self-pay | Admitting: Obstetrics

## 2014-01-09 ENCOUNTER — Ambulatory Visit (INDEPENDENT_AMBULATORY_CARE_PROVIDER_SITE_OTHER): Payer: Medicaid Other | Admitting: Obstetrics

## 2014-01-09 VITALS — BP 127/81 | HR 83 | Temp 98.1°F | Ht 61.0 in | Wt 185.0 lb

## 2014-01-09 DIAGNOSIS — Z3201 Encounter for pregnancy test, result positive: Secondary | ICD-10-CM

## 2014-01-09 DIAGNOSIS — N926 Irregular menstruation, unspecified: Secondary | ICD-10-CM

## 2014-01-09 LAB — POCT URINE PREGNANCY: Preg Test, Ur: POSITIVE

## 2014-01-09 NOTE — Progress Notes (Signed)
Subjective:     Cindy Gardner is a 28 y.o. female here for a routine exam.  Current complaints: Patient is in the office today for a problem visit. Patient states that in December she had a cycle but that it only lasted for two days. Patient states she did not have a cycle in January or February. Patient states that when she was pregnant before she would have really bad anxiety. Patient states she has been having anxiety and she has been crying a lot. Patient states she is just not her normal self at all. Patient states her cycles normally last about 7 days. UPT preformed, results were Positive. Patient notified that she was pregnant and that based of last LMP that she was 9 weeks and 5 days and that her EDD was August 09, 2014. Patient notified to schedule a NOB appointment up front.    Personal health questionnaire reviewed: yes.   Gynecologic History Patient's last menstrual period was 11/02/2013. Contraception: none Last Pap: 03/2013. Results were: normal  Obstetric History OB History  Gravida Para Term Preterm AB SAB TAB Ectopic Multiple Living  5 5 2 3      5     # Outcome Date GA Lbr Len/2nd Weight Sex Delivery Anes PTL Lv  5 TRM 11/20/11 5478w6d  8 lb 4.5 oz (3.755 kg) F LTCS EPI  Y  4 TRM     F    Y     Comments: 38wks  3 PRE     F    Y     Comments: 6234  2 PRE     M    Y     Comments: 32wks  1 PRE     F    Y     Comments: 23wk gest       The following portions of the patient's history were reviewed and updated as appropriate: allergies, current medications, past family history, past medical history, past social history, past surgical history and problem list.  Review of Systems Pertinent items are noted in HPI.    Objective:    No exam performed today, Pregnancy confirmation only.  Patient not seen by physician..    Assessment:    Missed period.  Positive pregnancy.   Plan:    F/U prn.

## 2014-01-15 ENCOUNTER — Encounter: Payer: Medicaid Other | Admitting: Obstetrics

## 2014-01-31 ENCOUNTER — Ambulatory Visit (INDEPENDENT_AMBULATORY_CARE_PROVIDER_SITE_OTHER): Payer: Medicaid Other | Admitting: Obstetrics

## 2014-01-31 ENCOUNTER — Encounter: Payer: Self-pay | Admitting: Obstetrics

## 2014-01-31 ENCOUNTER — Encounter: Payer: Self-pay | Admitting: *Deleted

## 2014-01-31 VITALS — BP 132/78 | Temp 98.1°F | Wt 186.0 lb

## 2014-01-31 DIAGNOSIS — Z1389 Encounter for screening for other disorder: Secondary | ICD-10-CM

## 2014-01-31 DIAGNOSIS — Z3480 Encounter for supervision of other normal pregnancy, unspecified trimester: Secondary | ICD-10-CM

## 2014-01-31 DIAGNOSIS — Z3687 Encounter for antenatal screening for uncertain dates: Secondary | ICD-10-CM

## 2014-01-31 MED ORDER — CITRANATAL HARMONY 27-1-250 MG PO CAPS: 1.0000 | ORAL_CAPSULE | Freq: Every day | ORAL | Status: DC

## 2014-01-31 NOTE — Addendum Note (Signed)
Addended by: Odessa FlemingBOHNE, Ottilie Wigglesworth M on: 01/31/2014 01:18 PM   Modules accepted: Orders

## 2014-01-31 NOTE — Progress Notes (Signed)
Subjective:    Cindy Gardner is being seen today for her first obstetrical visit.  This is not a planned pregnancy. She is at 758w6d gestation. Her obstetrical history is significant for smoker, Patient states she had Pre- Eclampsia, Hypertension. Relationship with FOB: significant other, living together. Patient does not intend to breast feed. Pregnancy history fully reviewed. Patient states she is starting to see spots. Patient states she is starting feel depressed. Patient states that when she is sitting at home she will feel like the walls are closing in on her. Patient state she will be feeling mad, sad and irritated for no reason. 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 Patient states she would like to keep her job but maybe work 1st or 2nd shift. Patient states she could work 3rd shift no problem before she was pregnant but lately it has just been too much for her. Patient is unsure of her last cycle.  Pulse: 85  Menstrual History: OB History   Grav Para Term Preterm Abortions TAB SAB Ect Mult Living   6 5 1 4      5       Menarche age: [age]  Patient's last menstrual period was 11/02/2013.    The following portions of the patient's history were reviewed and updated as appropriate: allergies, current medications, past family history, past medical history, past social history, past surgical history and problem list.  Review of Systems Pertinent items are noted in HPI.    Objective:    General appearance: alert and no distress Abdomen: normal findings: soft, non-tender Pelvic: cervix normal in appearance, external genitalia normal, no adnexal masses or tenderness, no cervical motion tenderness, rectovaginal septum normal, vagina normal without discharge and uterus enlarged, soft, NT. Extremities: extremities normal, atraumatic, no cyanosis or edema    Assessment:    Pregnancy at 4258w6d  weeks  Unsure LMP.   Plan:    Initial labs drawn. Prenatal vitamins.  Counseling provided regarding continued use of seat belts, cessation of alcohol consumption, smoking or use of illicit drugs; infection precautions i.e., influenza/TDAP immunizations, toxoplasmosis,CMV, parvovirus, listeria and varicella; workplace safety, exercise during pregnancy; routine dental care, safe medications, sexual activity, hot tubs, saunas, pools, travel, caffeine use, fish and methlymercury, potential toxins, hair treatments, varicose veins Weight gain recommendations per IOM guidelines reviewed: underweight/BMI< 18.5--> gain 28 - 40 lbs; normal weight/BMI 18.5 - 24.9--> gain 25 - 35 lbs; overweight/BMI 25 - 29.9--> gain 15 - 25 lbs; obese/BMI >30->gain  11 - 20 lbs Problem list reviewed and updated. FIRST/CF mutation testing/NIPT/QUAD SCREEN discussed: requested. Role of ultrasound in pregnancy discussed; fetal survey: requested. Amniocentesis discussed: not indicated. VBAC calculator score: VBAC consent form provided Follow up in 4 weeks. 50% of 20 min visit spent on counseling and coordination of care.  Ultrasound ordered for dating.

## 2014-01-31 NOTE — Addendum Note (Signed)
Addended by: Odessa FlemingBOHNE, Reece Fehnel M on: 01/31/2014 11:29 AM   Modules accepted: Orders

## 2014-01-31 NOTE — Addendum Note (Signed)
Addended by: Odessa FlemingBOHNE, Rosabella Edgin M on: 01/31/2014 01:34 PM   Modules accepted: Orders

## 2014-02-01 LAB — OBSTETRIC PANEL
Antibody Screen: NEGATIVE
BASOS ABS: 0 10*3/uL (ref 0.0–0.1)
Basophils Relative: 0 % (ref 0–1)
EOS ABS: 0.1 10*3/uL (ref 0.0–0.7)
EOS PCT: 1 % (ref 0–5)
HCT: 36.8 % (ref 36.0–46.0)
Hemoglobin: 12.3 g/dL (ref 12.0–15.0)
Hepatitis B Surface Ag: NEGATIVE
Lymphocytes Relative: 28 % (ref 12–46)
Lymphs Abs: 3.4 10*3/uL (ref 0.7–4.0)
MCH: 29.3 pg (ref 26.0–34.0)
MCHC: 33.4 g/dL (ref 30.0–36.0)
MCV: 87.6 fL (ref 78.0–100.0)
Monocytes Absolute: 0.8 10*3/uL (ref 0.1–1.0)
Monocytes Relative: 7 % (ref 3–12)
Neutro Abs: 7.7 10*3/uL (ref 1.7–7.7)
Neutrophils Relative %: 64 % (ref 43–77)
PLATELETS: 196 10*3/uL (ref 150–400)
RBC: 4.2 MIL/uL (ref 3.87–5.11)
RDW: 14.4 % (ref 11.5–15.5)
Rh Type: POSITIVE
Rubella: 0.88 Index (ref <0.90)
WBC: 12 10*3/uL — ABNORMAL HIGH (ref 4.0–10.5)

## 2014-02-01 LAB — PAP IG W/ RFLX HPV ASCU

## 2014-02-01 LAB — TSH: TSH: 0.645 u[IU]/mL (ref 0.350–4.500)

## 2014-02-01 LAB — VITAMIN D 25 HYDROXY (VIT D DEFICIENCY, FRACTURES): Vit D, 25-Hydroxy: 15 ng/mL — ABNORMAL LOW (ref 30–89)

## 2014-02-01 LAB — WET PREP BY MOLECULAR PROBE
CANDIDA SPECIES: NEGATIVE
Gardnerella vaginalis: POSITIVE — AB
TRICHOMONAS VAG: NEGATIVE

## 2014-02-01 LAB — HEMOGLOBINOPATHY EVALUATION
HGB A: 97.4 % (ref 96.8–97.8)
HGB F QUANT: 0 % (ref 0.0–2.0)
HGB S QUANTITAION: 0 %
Hemoglobin Other: 0 %
Hgb A2 Quant: 2.6 % (ref 2.2–3.2)

## 2014-02-01 LAB — VARICELLA ZOSTER ANTIBODY, IGG: Varicella IgG: 634.2 Index — ABNORMAL HIGH (ref <135.00)

## 2014-02-01 LAB — GC/CHLAMYDIA PROBE AMP
CT Probe RNA: NEGATIVE
GC PROBE AMP APTIMA: NEGATIVE

## 2014-02-01 LAB — HIV ANTIBODY (ROUTINE TESTING W REFLEX): HIV: NONREACTIVE

## 2014-02-03 LAB — CULTURE, OB URINE: Colony Count: 30000

## 2014-02-14 ENCOUNTER — Ambulatory Visit (HOSPITAL_COMMUNITY): Payer: Medicaid Other | Attending: Obstetrics

## 2014-02-15 ENCOUNTER — Other Ambulatory Visit: Payer: Self-pay | Admitting: *Deleted

## 2014-02-15 ENCOUNTER — Encounter: Payer: Self-pay | Admitting: *Deleted

## 2014-02-15 DIAGNOSIS — B9689 Other specified bacterial agents as the cause of diseases classified elsewhere: Secondary | ICD-10-CM

## 2014-02-15 DIAGNOSIS — N390 Urinary tract infection, site not specified: Secondary | ICD-10-CM

## 2014-02-15 DIAGNOSIS — N760 Acute vaginitis: Principal | ICD-10-CM

## 2014-02-15 MED ORDER — METRONIDAZOLE 500 MG PO TABS: 500.0000 mg | ORAL_TABLET | Freq: Two times a day (BID) | ORAL | Status: DC

## 2014-02-15 MED ORDER — CEPHALEXIN 500 MG PO CAPS: 500.0000 mg | ORAL_CAPSULE | Freq: Two times a day (BID) | ORAL | Status: DC

## 2014-02-28 ENCOUNTER — Ambulatory Visit (INDEPENDENT_AMBULATORY_CARE_PROVIDER_SITE_OTHER): Payer: Medicaid Other | Admitting: Obstetrics

## 2014-02-28 VITALS — BP 136/80 | HR 87 | Temp 97.9°F | Wt 189.0 lb

## 2014-02-28 DIAGNOSIS — Z3480 Encounter for supervision of other normal pregnancy, unspecified trimester: Secondary | ICD-10-CM

## 2014-02-28 DIAGNOSIS — Z1389 Encounter for screening for other disorder: Secondary | ICD-10-CM

## 2014-02-28 DIAGNOSIS — Z202 Contact with and (suspected) exposure to infections with a predominantly sexual mode of transmission: Secondary | ICD-10-CM

## 2014-02-28 DIAGNOSIS — N760 Acute vaginitis: Secondary | ICD-10-CM

## 2014-02-28 DIAGNOSIS — Z363 Encounter for antenatal screening for malformations: Secondary | ICD-10-CM

## 2014-02-28 LAB — POCT URINALYSIS DIPSTICK
BILIRUBIN UA: NEGATIVE
GLUCOSE UA: NEGATIVE
Ketones, UA: NEGATIVE
LEUKOCYTES UA: NEGATIVE
NITRITE UA: NEGATIVE
RBC UA: NEGATIVE
Spec Grav, UA: 1.02
Urobilinogen, UA: NEGATIVE
pH, UA: 6

## 2014-02-28 MED ORDER — TINIDAZOLE 250 MG PO TABS: ORAL_TABLET | ORAL | Status: DC

## 2014-03-01 ENCOUNTER — Encounter: Payer: Self-pay | Admitting: Obstetrics

## 2014-03-01 DIAGNOSIS — Z202 Contact with and (suspected) exposure to infections with a predominantly sexual mode of transmission: Secondary | ICD-10-CM | POA: Insufficient documentation

## 2014-03-01 DIAGNOSIS — N760 Acute vaginitis: Secondary | ICD-10-CM | POA: Insufficient documentation

## 2014-03-01 LAB — AFP, QUAD SCREEN
AFP: 49.2 IU/mL
Curr Gest Age: 16.6 wks.days
Down Syndrome Scr Risk Est: 1:38500 {titer}
HCG TOTAL: 6702 m[IU]/mL
INH: 356.7 pg/mL
INTERPRETATION-AFP: NEGATIVE
MOM FOR AFP: 1.5
MOM FOR HCG: 0.34
MoM for INH: 2.19
OPEN SPINA BIFIDA: NEGATIVE
Osb Risk: 1:5500 {titer}
TRI 18 SCR RISK EST: NEGATIVE
Trisomy 18 (Edward) Syndrome Interp.: <1:14200 {titer}
UE3 MOM: 2.89
UE3 VALUE: 1.6 ng/mL

## 2014-03-01 LAB — WET PREP BY MOLECULAR PROBE
Candida species: NEGATIVE
Gardnerella vaginalis: POSITIVE — AB
Trichomonas vaginosis: NEGATIVE

## 2014-03-01 LAB — GC/CHLAMYDIA PROBE AMP
CT PROBE, AMP APTIMA: NEGATIVE
GC PROBE AMP APTIMA: NEGATIVE

## 2014-03-01 NOTE — Progress Notes (Addendum)
  Subjective:    Cindy Gardner is a 28 y.o. female being seen today for her obstetrical visit. She is at 6262w0d gestation. Patient reports: no complaints.  Has h/o Trichomonas and is concerned about possible re-exposure.  Wants STD testing.    Past Medical History  Diagnosis Date  . Pregnancy induced hypertension   . Depression   . Anxiety     Past Surgical History  Procedure Laterality Date  . No past surgeries    . Cesarean section  11/20/2011    Procedure: CESAREAN SECTION;  Surgeon: Kathreen CosierBernard A Marshall, MD;  Location: [facility];  Service: Gynecology;  Laterality: N/A;    Current outpatient prescriptions:Prenat w/o A-FeCbn-DSS-FA-DHA (CITRANATAL HARMONY) 27-1-250 MG CAPS, Take 1 capsule by mouth daily before breakfast., Disp: 90 capsule, Rfl: 3;  tinidazole (TINDAMAX) 250 MG tablet, 2 grams po daily x 2 days., Disp: 8 tablet, Rfl: 1 No Known Allergies  History  Substance Use Topics  . Smoking status: Current Every Day Smoker -- 1.00 packs/day    Types: Cigarettes  . Smokeless tobacco: Never Used  . Alcohol Use: No    Family History  Problem Relation Age of Onset  . Anesthesia problems Neg Hx      Review of Systems Constitutional: negative for anorexia Gastrointestinal: negative for abdominal pain Genitourinary:positive for vaginal discharge Musculoskeletal:negative for back pain Behavioral/Psych: negative for depression and tobacco use   Objective:     BP 136/80  Pulse 87  Temp(Src) 97.9 F (36.6 C)  Wt 189 lb (85.73 kg)  LMP 11/02/2013 Uterine Size: Below umbilicus PE:      NEFG.      Vagina with grey bubbly discharge.      Cervix normal     Assessment:    Pregnancy @ 8162w0d  Weeks.  H/O Trichomonas. Doing well    Plan:   Wet prep and GC / CT cultures done.  Problem list reviewed and updated. Labs reviewed. Ultrasound ordered.  Follow up in 4 weeks. FIRST/CF mutation testing/NIPT/QUAD SCREEN/fragile X/Ashkenazi Jewish population testing/Spinal  muscular atrophy discussed: requested. Role of ultrasound in pregnancy discussed; fetal survey: requested. Amniocentesis discussed: not indicated.

## 2014-03-26 ENCOUNTER — Encounter: Payer: Self-pay | Admitting: Obstetrics

## 2014-03-26 ENCOUNTER — Encounter: Payer: Self-pay | Admitting: *Deleted

## 2014-03-26 ENCOUNTER — Ambulatory Visit (INDEPENDENT_AMBULATORY_CARE_PROVIDER_SITE_OTHER): Payer: Medicaid Other | Admitting: Obstetrics

## 2014-03-26 ENCOUNTER — Other Ambulatory Visit: Payer: Self-pay | Admitting: *Deleted

## 2014-03-26 ENCOUNTER — Ambulatory Visit (INDEPENDENT_AMBULATORY_CARE_PROVIDER_SITE_OTHER): Payer: Medicaid Other

## 2014-03-26 VITALS — BP 129/82 | HR 71 | Temp 98.2°F | Wt 195.0 lb

## 2014-03-26 DIAGNOSIS — Z363 Encounter for antenatal screening for malformations: Secondary | ICD-10-CM

## 2014-03-26 DIAGNOSIS — B9689 Other specified bacterial agents as the cause of diseases classified elsewhere: Secondary | ICD-10-CM

## 2014-03-26 DIAGNOSIS — Z1389 Encounter for screening for other disorder: Secondary | ICD-10-CM

## 2014-03-26 DIAGNOSIS — Z3480 Encounter for supervision of other normal pregnancy, unspecified trimester: Secondary | ICD-10-CM

## 2014-03-26 DIAGNOSIS — K219 Gastro-esophageal reflux disease without esophagitis: Secondary | ICD-10-CM

## 2014-03-26 DIAGNOSIS — F17200 Nicotine dependence, unspecified, uncomplicated: Secondary | ICD-10-CM

## 2014-03-26 DIAGNOSIS — N760 Acute vaginitis: Principal | ICD-10-CM

## 2014-03-26 LAB — POCT URINALYSIS DIPSTICK
Bilirubin, UA: NEGATIVE
Glucose, UA: NEGATIVE
Ketones, UA: NEGATIVE
Leukocytes, UA: NEGATIVE
Nitrite, UA: NEGATIVE
PROTEIN UA: NEGATIVE
RBC UA: NEGATIVE
SPEC GRAV UA: 1.015
UROBILINOGEN UA: NEGATIVE
pH, UA: 6

## 2014-03-26 LAB — US OB COMP + 14 WK

## 2014-03-26 MED ORDER — METRONIDAZOLE 500 MG PO TABS: 500.0000 mg | ORAL_TABLET | Freq: Two times a day (BID) | ORAL | Status: DC

## 2014-03-26 MED ORDER — OMEPRAZOLE 20 MG PO CPDR: 20.0000 mg | DELAYED_RELEASE_CAPSULE | Freq: Two times a day (BID) | ORAL | Status: DC

## 2014-03-26 NOTE — Progress Notes (Signed)
Subjective:    Cindy Gardner is a 28 y.o. female being seen today for her obstetrical visit. She is at 1636w3d gestation. Patient reports: heartburn . Fetal movement: normal.  Problem List Items Addressed This Visit   None    Visit Diagnoses   GERD without esophagitis    -  Primary    Relevant Medications       omeprazole (PRILOSEC) capsule      Patient Active Problem List   Diagnosis Date Noted  . Possible exposure to STD 03/01/2014  . Vaginitis and vulvovaginitis, unspecified 03/01/2014   Objective:    BP 129/82  Pulse 71  Temp(Src) 98.2 F (36.8 C)  Wt 195 lb (88.451 kg)  LMP 11/02/2013 FHT: 150 BPM  Uterine Size: size greater than dates     Assessment:    Pregnancy @ 8036w3d    Plan:    OBGCT: ordered for next visit. Smoking cessation discussed smokes 1 PPD.  Labs, problem list reviewed and updated 2 hr GTT planned Follow up in 2 weeks.

## 2014-04-10 ENCOUNTER — Encounter: Payer: Medicaid Other | Admitting: Obstetrics

## 2014-04-10 ENCOUNTER — Other Ambulatory Visit: Payer: Medicaid Other

## 2014-06-04 ENCOUNTER — Encounter: Payer: Self-pay | Admitting: Obstetrics

## 2014-06-04 ENCOUNTER — Telehealth: Payer: Self-pay

## 2014-06-04 ENCOUNTER — Ambulatory Visit (INDEPENDENT_AMBULATORY_CARE_PROVIDER_SITE_OTHER): Payer: Medicaid Other | Admitting: Obstetrics

## 2014-06-04 VITALS — BP 127/77 | HR 101 | Wt 198.0 lb

## 2014-06-04 DIAGNOSIS — F3289 Other specified depressive episodes: Secondary | ICD-10-CM

## 2014-06-04 DIAGNOSIS — F329 Major depressive disorder, single episode, unspecified: Secondary | ICD-10-CM

## 2014-06-04 DIAGNOSIS — O3660X0 Maternal care for excessive fetal growth, unspecified trimester, not applicable or unspecified: Secondary | ICD-10-CM

## 2014-06-04 DIAGNOSIS — Z3480 Encounter for supervision of other normal pregnancy, unspecified trimester: Secondary | ICD-10-CM

## 2014-06-04 DIAGNOSIS — Z3483 Encounter for supervision of other normal pregnancy, third trimester: Secondary | ICD-10-CM

## 2014-06-04 DIAGNOSIS — O3663X9 Maternal care for excessive fetal growth, third trimester, other fetus: Secondary | ICD-10-CM

## 2014-06-04 LAB — POCT URINALYSIS DIPSTICK
BILIRUBIN UA: NEGATIVE
Blood, UA: NEGATIVE
Glucose, UA: NEGATIVE
Leukocytes, UA: NEGATIVE
Nitrite, UA: NEGATIVE
PH UA: 6
SPEC GRAV UA: 1.015
Urobilinogen, UA: NEGATIVE

## 2014-06-04 MED ORDER — SERTRALINE HCL 50 MG PO TABS: 50.0000 mg | ORAL_TABLET | Freq: Every day | ORAL | Status: DC

## 2014-06-04 NOTE — Progress Notes (Signed)
Subjective:    Cindy Gardner is a 28 y.o. female being seen today for her obstetrical visit. She is at 2464w3d gestation. Patient reports no complaints. Fetal movement: normal.  Problem List Items Addressed This Visit   None    Visit Diagnoses   Encounter for supervision of other normal pregnancy in third trimester    -  Primary    Relevant Orders       POCT urinalysis dipstick (Completed)    Depressive disorder, not elsewhere classified        Relevant Medications       sertraline (ZOLOFT) tablet    Excessive fetal growth affecting management of mother, antepartum, third trimester, other fetus        Relevant Orders       US OB Follow Up      Patient Active Problem List   Diagnosis Date Noted  . Tobacco dependence 03/26/2014  . Possible exposure to STD 03/01/2014  . Vaginitis and vulvovaginitis, unspecified 03/01/2014   Objective:    BP 127/77  Pulse 101  Wt 198 lb (89.812 kg)  LMP 11/02/2013 FHT:  150 BPM  Uterine Size: size equals dates  Presentation: unsure     Assessment:    Pregnancy @ 6464w3d weeks   Plan:     labs reviewed, problem list updated Consent signed. GBS sent TDAP offered  Rhogam given for RH negative Pediatrician: discussed. Infant feeding: plans to breastfeed. Maternity leave: discussed. Cigarette smoking: smokes 1 PPD. Orders Placed This Encounter  Procedures  . US OB Follow Up    Standing Status: Future     Number of Occurrences:      Standing Expiration Date: 08/06/2015    Order Specific Question:  Reason for Exam (SYMPTOM  OR DIAGNOSIS REQUIRED)    Answer:  [phone]    Order Specific Question:  Preferred imaging location?    [facility]  . POCT urinalysis dipstick   Meds ordered this encounter  Medications  . sertraline (ZOLOFT) 50 MG tablet    Sig: Take 1 tablet (50 mg total) by mouth daily.    Dispense:  30 tablet    Refill:  5   Follow up in 1 Week.

## 2014-06-04 NOTE — Telephone Encounter (Signed)
called patient with ultrasound and rob appt times on 06/11/14

## 2014-06-07 ENCOUNTER — Telehealth: Payer: Self-pay

## 2014-06-07 NOTE — Telephone Encounter (Signed)
Patient has appt time for 8/4 u/s

## 2014-06-11 ENCOUNTER — Ambulatory Visit (HOSPITAL_COMMUNITY)
Admission: RE | Admit: 2014-06-11 | Discharge: 2014-06-11 | Disposition: A | Discharge status: Home / Self Care | Payer: Medicaid Other | Source: Ambulatory Visit | Attending: Obstetrics | Admitting: Obstetrics

## 2014-06-11 ENCOUNTER — Other Ambulatory Visit: Payer: Self-pay | Admitting: Obstetrics

## 2014-06-11 ENCOUNTER — Encounter: Payer: Medicaid Other | Admitting: Obstetrics

## 2014-06-11 DIAGNOSIS — O3660X0 Maternal care for excessive fetal growth, unspecified trimester, not applicable or unspecified: Secondary | ICD-10-CM | POA: Diagnosis present

## 2014-06-11 DIAGNOSIS — O3663X9 Maternal care for excessive fetal growth, third trimester, other fetus: Secondary | ICD-10-CM

## 2014-06-11 DIAGNOSIS — Z3689 Encounter for other specified antenatal screening: Secondary | ICD-10-CM | POA: Diagnosis not present

## 2014-06-24 ENCOUNTER — Encounter: Payer: Medicaid Other | Admitting: Obstetrics

## 2014-06-25 ENCOUNTER — Encounter: Payer: Medicaid Other | Admitting: Obstetrics

## 2014-07-11 ENCOUNTER — Ambulatory Visit (INDEPENDENT_AMBULATORY_CARE_PROVIDER_SITE_OTHER): Payer: Medicaid Other | Admitting: Obstetrics

## 2014-07-11 ENCOUNTER — Inpatient Hospital Stay (HOSPITAL_COMMUNITY)
Admission: AD | Admit: 2014-07-11 | Discharge: 2014-07-11 | Disposition: A | Discharge status: Home / Self Care | Payer: Medicaid Other | Source: Ambulatory Visit | Attending: Obstetrics | Admitting: Obstetrics

## 2014-07-11 ENCOUNTER — Encounter: Payer: Self-pay | Admitting: Obstetrics

## 2014-07-11 ENCOUNTER — Encounter (HOSPITAL_COMMUNITY): Payer: Self-pay | Admitting: Pediatrics

## 2014-07-11 ENCOUNTER — Telehealth: Payer: Self-pay | Admitting: *Deleted

## 2014-07-11 VITALS — BP 153/96 | HR 111 | Temp 98.3°F | Wt 209.0 lb

## 2014-07-11 DIAGNOSIS — O139 Gestational [pregnancy-induced] hypertension without significant proteinuria, unspecified trimester: Secondary | ICD-10-CM | POA: Diagnosis not present

## 2014-07-11 DIAGNOSIS — O99340 Other mental disorders complicating pregnancy, unspecified trimester: Secondary | ICD-10-CM

## 2014-07-11 DIAGNOSIS — O169 Unspecified maternal hypertension, unspecified trimester: Secondary | ICD-10-CM

## 2014-07-11 DIAGNOSIS — O0930 Supervision of pregnancy with insufficient antenatal care, unspecified trimester: Secondary | ICD-10-CM

## 2014-07-11 DIAGNOSIS — F3289 Other specified depressive episodes: Secondary | ICD-10-CM

## 2014-07-11 DIAGNOSIS — Z3483 Encounter for supervision of other normal pregnancy, third trimester: Secondary | ICD-10-CM

## 2014-07-11 DIAGNOSIS — F329 Major depressive disorder, single episode, unspecified: Secondary | ICD-10-CM

## 2014-07-11 DIAGNOSIS — F411 Generalized anxiety disorder: Secondary | ICD-10-CM

## 2014-07-11 DIAGNOSIS — O0933 Supervision of pregnancy with insufficient antenatal care, third trimester: Secondary | ICD-10-CM

## 2014-07-11 DIAGNOSIS — O99330 Smoking (tobacco) complicating pregnancy, unspecified trimester: Secondary | ICD-10-CM | POA: Insufficient documentation

## 2014-07-11 DIAGNOSIS — O163 Unspecified maternal hypertension, third trimester: Secondary | ICD-10-CM

## 2014-07-11 DIAGNOSIS — R030 Elevated blood-pressure reading, without diagnosis of hypertension: Secondary | ICD-10-CM | POA: Diagnosis present

## 2014-07-11 DIAGNOSIS — Z3480 Encounter for supervision of other normal pregnancy, unspecified trimester: Secondary | ICD-10-CM

## 2014-07-11 LAB — COMPREHENSIVE METABOLIC PANEL
ALK PHOS: 224 U/L — AB (ref 39–117)
ALT: 9 U/L (ref 0–35)
AST: 15 U/L (ref 0–37)
Albumin: 3.1 g/dL — ABNORMAL LOW (ref 3.5–5.2)
Anion gap: 11 (ref 5–15)
BILIRUBIN TOTAL: 0.4 mg/dL (ref 0.3–1.2)
BUN: 7 mg/dL (ref 6–23)
CALCIUM: 9.1 mg/dL (ref 8.4–10.5)
CHLORIDE: 103 meq/L (ref 96–112)
CO2: 23 meq/L (ref 19–32)
Creatinine, Ser: 0.62 mg/dL (ref 0.50–1.10)
GLUCOSE: 81 mg/dL (ref 70–99)
Potassium: 4.3 mEq/L (ref 3.7–5.3)
SODIUM: 137 meq/L (ref 137–147)
Total Protein: 7.3 g/dL (ref 6.0–8.3)

## 2014-07-11 LAB — POCT URINALYSIS DIPSTICK
BILIRUBIN UA: NEGATIVE
GLUCOSE UA: NEGATIVE
KETONES UA: NEGATIVE
LEUKOCYTES UA: NEGATIVE
NITRITE UA: NEGATIVE
PH UA: 6
Protein, UA: NEGATIVE
RBC UA: NEGATIVE
Spec Grav, UA: 1.01
Urobilinogen, UA: NEGATIVE

## 2014-07-11 LAB — PROTEIN / CREATININE RATIO, URINE
Creatinine, Urine: 217.3 mg/dL
PROTEIN CREATININE RATIO: 0.08 (ref 0.00–0.15)
Total Protein, Urine: 17.7 mg/dL

## 2014-07-11 LAB — CBC
HCT: 37 % (ref 36.0–46.0)
HEMOGLOBIN: 12.4 g/dL (ref 12.0–15.0)
MCH: 28.7 pg (ref 26.0–34.0)
MCHC: 33.5 g/dL (ref 30.0–36.0)
MCV: 85.6 fL (ref 78.0–100.0)
PLATELETS: 126 10*3/uL — AB (ref 150–400)
RBC: 4.32 MIL/uL (ref 3.87–5.11)
RDW: 13.5 % (ref 11.5–15.5)
WBC: 14.2 10*3/uL — AB (ref 4.0–10.5)

## 2014-07-11 NOTE — Discharge Instructions (Signed)

## 2014-07-11 NOTE — Telephone Encounter (Signed)
Patient called and wanted to discuss delivery with Dr Clearance Coots- as she is 38 weeks and she is ready.  Attempted to call patient as she has not been in the office since 06/04/2014 and she needs to come in- left message to come tomorrow at 1:00. 07/11/2014 10:30 [employer] called patient and she also left message for patient to come to the office at 1:00 today.

## 2014-07-11 NOTE — Progress Notes (Signed)
Pt on the phone with the MD's office, trying to make an appointment .

## 2014-07-11 NOTE — MAU Provider Note (Signed)
History     CSN: [id number]  Arrival date and time: 07/11/14 1442   First Provider Initiated Contact with Patient 07/11/14 1532      No chief complaint on file.  HPI  Cindy Gardner is a 28 y.o. [id number] at [redacted]w[redacted]d who presents today from the office with elevated blood pressure. She states that her blood pressure was 160/90 in the office, and that she "always has high blood pressure and pre-eclampsia" with all of her pregnancies. She denies any other complications with this pregnancy. She denies any VB or LOF. She reports normal fetal movement. She does report a headache since yesterday. She states that it continued into this morning, and has gotten worse throughout the day. She reports her pain 7/10. She has not taken anything for the pain at this time.   Past Medical History  Diagnosis Date  . Pregnancy induced hypertension   . Depression   . Anxiety     Past Surgical History  Procedure Laterality Date  . No past surgeries    . Cesarean section  11/20/2011    Procedure: CESAREAN SECTION;  Surgeon: Kathreen Cosier, MD;  Location: [facility];  Service: Gynecology;  Laterality: N/A;    Family History  Problem Relation Age of Onset  . Anesthesia problems Neg Hx     History  Substance Use Topics  . Smoking status: Current Every Day Smoker -- 1.00 packs/day    Types: Cigarettes  . Smokeless tobacco: Never Used  . Alcohol Use: No    Allergies: No Known Allergies  Prescriptions prior to admission  Medication Sig Dispense Refill  . calcium carbonate (TUMS - DOSED IN MG ELEMENTAL CALCIUM) 500 MG chewable tablet Chew 6 tablets by mouth daily.      . Prenat w/o A-FeCbn-DSS-FA-DHA (CITRANATAL HARMONY) 27-1-250 MG CAPS Take 1 capsule by mouth daily before breakfast.  90 capsule  3  . sertraline (ZOLOFT) 50 MG tablet Take 1 tablet (50 mg total) by mouth daily.  30 tablet  5    ROS Physical Exam   Blood pressure 129/83, pulse 90, last menstrual period 11/02/2013.  Physical  Exam  Nursing note and vitals reviewed. Constitutional: She is oriented to person, place, and time. She appears well-developed and well-nourished. No distress.  Cardiovascular: Normal rate.   Respiratory: Effort normal.  GI: Soft. There is no tenderness. There is no rebound.  Musculoskeletal: She exhibits no edema.  Neurological: She is alert and oriented to person, place, and time. She has normal reflexes.  No clonus  Skin: Skin is warm and dry.  Psychiatric: She has a normal mood and affect.  FHT 120, moderate with 15x15 accels, no decels Toco: irreg, and not noticed by the patient q 6-10 mins   MAU Course  Procedures  Results for orders placed during the hospital encounter of 07/11/14 (from the past 24 hour(s))  CBC     Status: Abnormal   Collection Time    07/11/14  2:45 PM      Result Value Ref Range   WBC 14.2 (*) 4.0 - 10.5 K/uL   RBC 4.32  3.87 - 5.11 MIL/uL   Hemoglobin 12.4  12.0 - 15.0 g/dL   HCT 02.7  25.3 - 66.4 %   MCV 85.6  78.0 - 100.0 fL   MCH 28.7  26.0 - 34.0 pg   MCHC 33.5  30.0 - 36.0 g/dL   RDW 40.3  47.4 - 25.9 %   Platelets 126 (*) 150 - 400  K/uL  COMPREHENSIVE METABOLIC PANEL     Status: Abnormal   Collection Time    07/11/14  2:45 PM      Result Value Ref Range   Sodium 137  137 - 147 mEq/L   Potassium 4.3  3.7 - 5.3 mEq/L   Chloride 103  96 - 112 mEq/L   CO2 23  19 - 32 mEq/L   Glucose, Bld 81  70 - 99 mg/dL   BUN 7  6 - 23 mg/dL   Creatinine, Ser 1.61  0.50 - 1.10 mg/dL   Calcium 9.1  8.4 - 09.6 mg/dL   Total Protein 7.3  6.0 - 8.3 g/dL   Albumin 3.1 (*) 3.5 - 5.2 g/dL   AST 15  0 - 37 U/L   ALT 9  0 - 35 U/L   Alkaline Phosphatase 224 (*) 39 - 117 U/L   Total Bilirubin 0.4  0.3 - 1.2 mg/dL   GFR calc non Af Amer >90  >90 mL/min   GFR calc Af Amer >90  >90 mL/min   Anion gap 11  5 - 15  PROTEIN / CREATININE RATIO, URINE     Status: None   Collection Time    07/11/14  3:05 PM      Result Value Ref Range   Creatinine, Urine 217.30      Total Protein, Urine 17.7     PROTEIN CREATININE RATIO 0.08  0.00 - 0.15   1608 D/W Dr. Clearance Coots, patient is ok for DC home. FU in the office as planned.  Assessment and Plan   1. Hypertension in pregnancy, antepartum, third trimester    Pre-eclampsia danger signs reviewed Labor precautions Fetal kick counts Return to MAU as neeed  Follow-up Information   Follow up with [facility]. (As scheduled)    Specialty:  Obstetrics and Gynecology   Contact information:   [address], [address] [place] [address] [phone]       Tawnya Crook 07/11/2014, 3:36 PM

## 2014-07-11 NOTE — Progress Notes (Signed)
Subjective:    Cindy Gardner is a 28 y.o. female being seen today for her obstetrical visit. She is at [redacted]w[redacted]d gestation. Patient reports no complaints. Fetal movement: normal.  Problem List Items Addressed This Visit   None    Visit Diagnoses   Encounter for supervision of other normal pregnancy in third trimester    -  Primary    Relevant Orders       POCT urinalysis dipstick       Strep B DNA probe    Prenatal care insufficient, third trimester        Relevant Orders       Fetal non-stress test      Patient Active Problem List   Diagnosis Date Noted  . Depressive disorder, not elsewhere classified 06/04/2014  . Tobacco dependence 03/26/2014  . Possible exposure to STD 03/01/2014  . Vaginitis and vulvovaginitis, unspecified 03/01/2014    Objective:    BP 153/96  Pulse 111  Temp(Src) 98.3 F (36.8 C)  Wt 209 lb (94.802 kg)  LMP 11/02/2013 FHT: 150 BPM  Uterine Size: size greater than dates  Presentations: unsure  Pelvic Exam: Deferred    Assessment:    Pregnancy @ [redacted]w[redacted]d weeks   Plan:   Plans for delivery: C/Section scheduled; labs reviewed; problem list updated Counseling: Consent signed. Infant feeding: plans to breastfeed. Cigarette smoking: smokes 1 PPD. L&D discussion: symptoms of labor, discussed when to call, discussed what number to call, anesthetic/analgesic options reviewed and delivering clinician:  plans Physician. Postpartum supports and preparation: circumcision discussed and contraception plans discussed. Sent to [facility] for PIH work up. Follow up in 1 Week.

## 2014-07-12 ENCOUNTER — Encounter (HOSPITAL_COMMUNITY): Payer: Self-pay | Admitting: Pharmacist

## 2014-07-12 LAB — STREP B DNA PROBE: GBSP: DETECTED

## 2014-07-16 ENCOUNTER — Encounter (HOSPITAL_COMMUNITY): Admission: AD | Payer: Self-pay | Source: Ambulatory Visit

## 2014-07-16 ENCOUNTER — Inpatient Hospital Stay (HOSPITAL_COMMUNITY): Admission: AD | Admit: 2014-07-16 | Payer: Medicaid Other | Source: Ambulatory Visit | Admitting: Obstetrics

## 2014-07-16 SURGERY — Surgical Case
Anesthesia: Regional

## 2014-07-16 NOTE — Anesthesia Preprocedure Evaluation (Signed)
Anesthesia Evaluation  Patient identified by MRN, date of birth, ID band Patient awake    Reviewed: Allergy & Precautions, H&P , NPO status , Patient's Chart, lab work & pertinent test results  Airway Mallampati: III      Dental   Pulmonary Current Smoker,  breath sounds clear to auscultation        Cardiovascular Exercise Tolerance: Good hypertension, Rhythm:regular Rate:Normal     Neuro/Psych    GI/Hepatic   Endo/Other  Morbid obesity  Renal/GU      Musculoskeletal   Abdominal   Peds  Hematology   Anesthesia Other Findings   Reproductive/Obstetrics (+) Pregnancy                           Anesthesia Physical Anesthesia Plan  ASA: III  Anesthesia Plan: Spinal   Post-op Pain Management:    Induction:   Airway Management Planned:   Additional Equipment:   Intra-op Plan:   Post-operative Plan:   Informed Consent: I have reviewed the patients History and Physical, chart, labs and discussed the procedure including the risks, benefits and alternatives for the proposed anesthesia with the patient or authorized representative who has indicated his/her understanding and acceptance.     Plan Discussed with: Anesthesiologist, CRNA and Surgeon  Anesthesia Plan Comments:         Anesthesia Quick Evaluation

## 2014-07-17 ENCOUNTER — Encounter (HOSPITAL_COMMUNITY)
Admission: RE | Admit: 2014-07-17 | Discharge: 2014-07-17 | Disposition: A | Discharge status: Home / Self Care | Payer: Medicaid Other | Source: Ambulatory Visit | Attending: Obstetrics | Admitting: Obstetrics

## 2014-07-17 ENCOUNTER — Encounter (HOSPITAL_COMMUNITY): Payer: Self-pay

## 2014-07-17 LAB — CBC
HEMATOCRIT: 36.3 % (ref 36.0–46.0)
HEMOGLOBIN: 12 g/dL (ref 12.0–15.0)
MCH: 28.3 pg (ref 26.0–34.0)
MCHC: 33.1 g/dL (ref 30.0–36.0)
MCV: 85.6 fL (ref 78.0–100.0)
Platelets: 122 10*3/uL — ABNORMAL LOW (ref 150–400)
RBC: 4.24 MIL/uL (ref 3.87–5.11)
RDW: 13.8 % (ref 11.5–15.5)
WBC: 11.4 10*3/uL — AB (ref 4.0–10.5)

## 2014-07-17 LAB — TYPE AND SCREEN
ABO/RH(D): A POS
ANTIBODY SCREEN: NEGATIVE

## 2014-07-17 MED ORDER — SCOPOLAMINE 1 MG/3DAYS TD PT72: 1.0000 | MEDICATED_PATCH | Freq: Once | TRANSDERMAL | Status: DC

## 2014-07-17 MED ORDER — LACTATED RINGERS IV SOLN: Freq: Once | INTRAVENOUS | Status: DC

## 2014-07-17 MED ORDER — LACTATED RINGERS IV SOLN: INTRAVENOUS | Status: DC

## 2014-07-17 NOTE — Patient Instructions (Addendum)
   Your procedure is scheduled on: SEPT 10 AT 1:45PM  Enter through the Main Entrance of [facility] at: SEPT 10 AT 12:15PM Pick up the phone at the desk and dial [phone] and inform us of your arrival.  Please call this number if you have any problems the morning of surgery: [phone]  Remember: Do not eat food after midnight:SEPT 9 Do not drink clear liquids after: 10AM SEPT 10 Take these medicines the morning of surgery with a SIP OF WATER: NONE  Do not wear jewelry, make-up, or FINGER nail polish No metal in your hair or on your body. Do not wear lotions, powders, perfumes.  You may wear deodorant.  Do not bring valuables to the hospital. Contacts, dentures or bridgework may not be worn into surgery.  Leave suitcase in the car. After Surgery it may be brought to your room. For patients being admitted to the hospital, checkout time is 11:00am the day of discharge.    Patients discharged on the day of surgery will not be allowed to drive home.

## 2014-07-18 ENCOUNTER — Inpatient Hospital Stay (HOSPITAL_COMMUNITY)
Admission: RE | Admit: 2014-07-18 | Discharge: 2014-07-20 | DRG: 765 | Disposition: A | Discharge status: Home / Self Care | Payer: Medicaid Other | Source: Ambulatory Visit | Attending: Obstetrics | Admitting: Obstetrics

## 2014-07-18 ENCOUNTER — Inpatient Hospital Stay (HOSPITAL_COMMUNITY): Payer: Medicaid Other | Admitting: Anesthesiology

## 2014-07-18 ENCOUNTER — Encounter (HOSPITAL_COMMUNITY): Payer: Self-pay | Admitting: *Deleted

## 2014-07-18 ENCOUNTER — Other Ambulatory Visit: Payer: Self-pay | Admitting: Obstetrics

## 2014-07-18 ENCOUNTER — Encounter (HOSPITAL_COMMUNITY): Payer: Medicaid Other | Admitting: Anesthesiology

## 2014-07-18 ENCOUNTER — Encounter (HOSPITAL_COMMUNITY)
Admission: RE | Disposition: A | Discharge status: Home / Self Care | Payer: Self-pay | Source: Ambulatory Visit | Attending: Obstetrics

## 2014-07-18 ENCOUNTER — Encounter: Payer: Medicaid Other | Admitting: Obstetrics

## 2014-07-18 DIAGNOSIS — O99334 Smoking (tobacco) complicating childbirth: Secondary | ICD-10-CM | POA: Diagnosis present

## 2014-07-18 DIAGNOSIS — O1002 Pre-existing essential hypertension complicating childbirth: Secondary | ICD-10-CM | POA: Diagnosis present

## 2014-07-18 DIAGNOSIS — O99214 Obesity complicating childbirth: Secondary | ICD-10-CM

## 2014-07-18 DIAGNOSIS — Z6839 Body mass index (BMI) 39.0-39.9, adult: Secondary | ICD-10-CM | POA: Diagnosis not present

## 2014-07-18 DIAGNOSIS — O34219 Maternal care for unspecified type scar from previous cesarean delivery: Secondary | ICD-10-CM | POA: Diagnosis present

## 2014-07-18 DIAGNOSIS — E669 Obesity, unspecified: Secondary | ICD-10-CM | POA: Diagnosis present

## 2014-07-18 DIAGNOSIS — Z98891 History of uterine scar from previous surgery: Secondary | ICD-10-CM

## 2014-07-18 LAB — PLATELET COUNT: Platelets: 116 10*3/uL — ABNORMAL LOW (ref 150–400)

## 2014-07-18 LAB — RPR

## 2014-07-18 SURGERY — Surgical Case
Anesthesia: Spinal | Site: Abdomen

## 2014-07-18 MED ORDER — BUPIVACAINE IN DEXTROSE 0.75-8.25 % IT SOLN
INTRATHECAL | Status: DC | PRN
  Administered 2014-07-18: 1.4 mL via INTRATHECAL

## 2014-07-18 MED ORDER — ONDANSETRON HCL 4 MG/2ML IJ SOLN
INTRAMUSCULAR | Status: AC
  Filled 2014-07-18: qty 2

## 2014-07-18 MED ORDER — SIMETHICONE 80 MG PO CHEW: 80.0000 mg | CHEWABLE_TABLET | ORAL | Status: DC | PRN

## 2014-07-18 MED ORDER — CEFAZOLIN SODIUM-DEXTROSE 2-3 GM-% IV SOLR
INTRAVENOUS | Status: AC
  Administered 2014-07-18: 2 g via INTRAVENOUS
  Filled 2014-07-18: qty 50

## 2014-07-18 MED ORDER — DEXAMETHASONE SODIUM PHOSPHATE 10 MG/ML IJ SOLN
INTRAMUSCULAR | Status: DC | PRN
  Administered 2014-07-18: 4 mg via INTRAVENOUS

## 2014-07-18 MED ORDER — KETOROLAC TROMETHAMINE 30 MG/ML IJ SOLN: 30.0000 mg | Freq: Four times a day (QID) | INTRAMUSCULAR | Status: DC | PRN

## 2014-07-18 MED ORDER — NALOXONE HCL 1 MG/ML IJ SOLN: 1.0000 ug/kg/h | INTRAVENOUS | Status: DC | PRN

## 2014-07-18 MED ORDER — METHYLERGONOVINE MALEATE 0.2 MG/ML IJ SOLN
0.2000 mg | Freq: Four times a day (QID) | INTRAMUSCULAR | Status: AC
  Administered 2014-07-18: 0.2 mg via INTRAMUSCULAR

## 2014-07-18 MED ORDER — OXYTOCIN 10 UNIT/ML IJ SOLN
INTRAMUSCULAR | Status: AC
  Filled 2014-07-18: qty 4

## 2014-07-18 MED ORDER — PHENYLEPHRINE 8 MG IN D5W 100 ML (0.08MG/ML) PREMIX OPTIME
INJECTION | INTRAVENOUS | Status: DC | PRN
  Administered 2014-07-18: 60 ug/min via INTRAVENOUS

## 2014-07-18 MED ORDER — SENNOSIDES-DOCUSATE SODIUM 8.6-50 MG PO TABS
2.0000 | ORAL_TABLET | ORAL | Status: DC
  Administered 2014-07-18 – 2014-07-19 (×2): 2 via ORAL
  Filled 2014-07-18 (×2): qty 2

## 2014-07-18 MED ORDER — LANOLIN HYDROUS EX OINT: 1.0000 "application " | TOPICAL_OINTMENT | CUTANEOUS | Status: DC | PRN

## 2014-07-18 MED ORDER — PRENATAL MULTIVITAMIN CH
1.0000 | ORAL_TABLET | Freq: Every day | ORAL | Status: DC
  Administered 2014-07-19: 1 via ORAL
  Filled 2014-07-18: qty 1

## 2014-07-18 MED ORDER — DIPHENHYDRAMINE HCL 25 MG PO CAPS
25.0000 mg | ORAL_CAPSULE | ORAL | Status: DC | PRN
  Administered 2014-07-18 – 2014-07-19 (×3): 25 mg via ORAL
  Filled 2014-07-18 (×3): qty 1

## 2014-07-18 MED ORDER — FENTANYL CITRATE 0.05 MG/ML IJ SOLN
INTRAMUSCULAR | Status: AC
  Filled 2014-07-18: qty 2

## 2014-07-18 MED ORDER — SIMETHICONE 80 MG PO CHEW
80.0000 mg | CHEWABLE_TABLET | ORAL | Status: DC
  Administered 2014-07-18 – 2014-07-19 (×2): 80 mg via ORAL
  Filled 2014-07-18 (×2): qty 1

## 2014-07-18 MED ORDER — IBUPROFEN 600 MG PO TABS: 600.0000 mg | ORAL_TABLET | Freq: Four times a day (QID) | ORAL | Status: DC | PRN

## 2014-07-18 MED ORDER — MEPERIDINE HCL 25 MG/ML IJ SOLN: 6.2500 mg | INTRAMUSCULAR | Status: DC | PRN

## 2014-07-18 MED ORDER — NALOXONE HCL 0.4 MG/ML IJ SOLN: 0.4000 mg | INTRAMUSCULAR | Status: DC | PRN

## 2014-07-18 MED ORDER — ONDANSETRON HCL 4 MG/2ML IJ SOLN: 4.0000 mg | Freq: Three times a day (TID) | INTRAMUSCULAR | Status: DC | PRN

## 2014-07-18 MED ORDER — OXYCODONE-ACETAMINOPHEN 5-325 MG PO TABS: 1.0000 | ORAL_TABLET | ORAL | Status: DC | PRN

## 2014-07-18 MED ORDER — NALBUPHINE HCL 10 MG/ML IJ SOLN
INTRAMUSCULAR | Status: AC
  Administered 2014-07-18: 10 mg via SUBCUTANEOUS
  Filled 2014-07-18: qty 1

## 2014-07-18 MED ORDER — DIPHENHYDRAMINE HCL 50 MG/ML IJ SOLN: 12.5000 mg | INTRAMUSCULAR | Status: DC | PRN

## 2014-07-18 MED ORDER — PHENYLEPHRINE 8 MG IN D5W 100 ML (0.08MG/ML) PREMIX OPTIME
INJECTION | INTRAVENOUS | Status: AC
  Filled 2014-07-18: qty 100

## 2014-07-18 MED ORDER — ONDANSETRON HCL 4 MG/2ML IJ SOLN
INTRAMUSCULAR | Status: DC | PRN
  Administered 2014-07-18: 4 mg via INTRAVENOUS

## 2014-07-18 MED ORDER — WITCH HAZEL-GLYCERIN EX PADS
1.0000 | MEDICATED_PAD | CUTANEOUS | Status: DC | PRN
Start: 2014-07-18 — End: 2014-07-20

## 2014-07-18 MED ORDER — SIMETHICONE 80 MG PO CHEW
80.0000 mg | CHEWABLE_TABLET | Freq: Three times a day (TID) | ORAL | Status: DC
  Administered 2014-07-19 – 2014-07-20 (×3): 80 mg via ORAL
  Filled 2014-07-18 (×3): qty 1

## 2014-07-18 MED ORDER — SODIUM CHLORIDE 0.9 % IJ SOLN
3.0000 mL | INTRAMUSCULAR | Status: DC | PRN
Start: 2014-07-18 — End: 2014-07-20

## 2014-07-18 MED ORDER — ACETAMINOPHEN 500 MG PO TABS
1000.0000 mg | ORAL_TABLET | Freq: Four times a day (QID) | ORAL | Status: AC
  Filled 2014-07-18: qty 2

## 2014-07-18 MED ORDER — METOCLOPRAMIDE HCL 5 MG/ML IJ SOLN
10.0000 mg | Freq: Three times a day (TID) | INTRAMUSCULAR | Status: DC | PRN
Start: 2014-07-18 — End: 2014-07-20

## 2014-07-18 MED ORDER — METHYLERGONOVINE MALEATE 0.2 MG/ML IJ SOLN
INTRAMUSCULAR | Status: AC
  Administered 2014-07-18: 0.2 mg via INTRAMUSCULAR
  Filled 2014-07-18: qty 1

## 2014-07-18 MED ORDER — DIBUCAINE 1 % RE OINT: 1.0000 "application " | TOPICAL_OINTMENT | RECTAL | Status: DC | PRN

## 2014-07-18 MED ORDER — ZOLPIDEM TARTRATE 5 MG PO TABS: 5.0000 mg | ORAL_TABLET | Freq: Every evening | ORAL | Status: DC | PRN

## 2014-07-18 MED ORDER — KETOROLAC TROMETHAMINE 30 MG/ML IJ SOLN
INTRAMUSCULAR | Status: AC
  Filled 2014-07-18: qty 1

## 2014-07-18 MED ORDER — LACTATED RINGERS IV SOLN
INTRAVENOUS | Status: DC
  Administered 2014-07-19 (×2): via INTRAVENOUS

## 2014-07-18 MED ORDER — MORPHINE SULFATE (PF) 0.5 MG/ML IJ SOLN
INTRAMUSCULAR | Status: DC | PRN
  Administered 2014-07-18: .15 mg via INTRATHECAL

## 2014-07-18 MED ORDER — FENTANYL CITRATE 0.05 MG/ML IJ SOLN
INTRAMUSCULAR | Status: AC
  Administered 2014-07-18: 50 ug via INTRAVENOUS
  Filled 2014-07-18: qty 2

## 2014-07-18 MED ORDER — DIPHENHYDRAMINE HCL 25 MG PO CAPS
25.0000 mg | ORAL_CAPSULE | Freq: Four times a day (QID) | ORAL | Status: DC | PRN
  Filled 2014-07-18: qty 1

## 2014-07-18 MED ORDER — SCOPOLAMINE 1 MG/3DAYS TD PT72: 1.0000 | MEDICATED_PATCH | Freq: Once | TRANSDERMAL | Status: DC

## 2014-07-18 MED ORDER — OXYTOCIN 40 UNITS IN LACTATED RINGERS INFUSION - SIMPLE MED: 62.5000 mL/h | INTRAVENOUS | Status: AC

## 2014-07-18 MED ORDER — NALBUPHINE HCL 10 MG/ML IJ SOLN
5.0000 mg | INTRAMUSCULAR | Status: DC | PRN
  Administered 2014-07-18: 10 mg via SUBCUTANEOUS

## 2014-07-18 MED ORDER — TETANUS-DIPHTH-ACELL PERTUSSIS 5-2.5-18.5 LF-MCG/0.5 IM SUSP: 0.5000 mL | Freq: Once | INTRAMUSCULAR | Status: DC

## 2014-07-18 MED ORDER — MORPHINE SULFATE 0.5 MG/ML IJ SOLN
INTRAMUSCULAR | Status: AC
  Filled 2014-07-18: qty 10

## 2014-07-18 MED ORDER — FENTANYL CITRATE 0.05 MG/ML IJ SOLN
25.0000 ug | INTRAMUSCULAR | Status: DC | PRN
  Administered 2014-07-18 (×2): 50 ug via INTRAVENOUS

## 2014-07-18 MED ORDER — METHYLERGONOVINE MALEATE 0.2 MG PO TABS
0.2000 mg | ORAL_TABLET | Freq: Four times a day (QID) | ORAL | Status: AC
  Administered 2014-07-18 – 2014-07-19 (×5): 0.2 mg via ORAL
  Filled 2014-07-18 (×5): qty 1

## 2014-07-18 MED ORDER — DIPHENHYDRAMINE HCL 50 MG/ML IJ SOLN: 25.0000 mg | INTRAMUSCULAR | Status: DC | PRN

## 2014-07-18 MED ORDER — MIDAZOLAM HCL 2 MG/2ML IJ SOLN: 0.5000 mg | Freq: Once | INTRAMUSCULAR | Status: DC | PRN

## 2014-07-18 MED ORDER — LACTATED RINGERS IV SOLN
INTRAVENOUS | Status: DC | PRN
  Administered 2014-07-18 (×3): via INTRAVENOUS

## 2014-07-18 MED ORDER — SCOPOLAMINE 1 MG/3DAYS TD PT72
MEDICATED_PATCH | TRANSDERMAL | Status: AC
  Administered 2014-07-18: 1.5 mg
  Filled 2014-07-18: qty 1

## 2014-07-18 MED ORDER — ONDANSETRON HCL 4 MG/2ML IJ SOLN: 4.0000 mg | INTRAMUSCULAR | Status: DC | PRN

## 2014-07-18 MED ORDER — ONDANSETRON HCL 4 MG PO TABS: 4.0000 mg | ORAL_TABLET | ORAL | Status: DC | PRN

## 2014-07-18 MED ORDER — MENTHOL 3 MG MT LOZG: 1.0000 | LOZENGE | OROMUCOSAL | Status: DC | PRN

## 2014-07-18 MED ORDER — OXYCODONE-ACETAMINOPHEN 5-325 MG PO TABS
2.0000 | ORAL_TABLET | ORAL | Status: DC | PRN
  Administered 2014-07-19 – 2014-07-20 (×8): 2 via ORAL
  Filled 2014-07-18 (×8): qty 2

## 2014-07-18 MED ORDER — PROMETHAZINE HCL 25 MG/ML IJ SOLN: 6.2500 mg | INTRAMUSCULAR | Status: DC | PRN

## 2014-07-18 MED ORDER — NALBUPHINE HCL 10 MG/ML IJ SOLN: 5.0000 mg | INTRAMUSCULAR | Status: DC | PRN

## 2014-07-18 MED ORDER — FENTANYL CITRATE 0.05 MG/ML IJ SOLN
INTRAMUSCULAR | Status: DC | PRN
  Administered 2014-07-18: 75 ug via INTRAVENOUS
  Administered 2014-07-18: 25 ug via INTRATHECAL

## 2014-07-18 SURGICAL SUPPLY — 44 items
ADH SKN CLS APL DERMABOND .7 (GAUZE/BANDAGES/DRESSINGS) ×1
BLADE SURG 10 STRL SS (BLADE) ×6 IMPLANT
CANISTER WOUND CARE 500ML ATS (WOUND CARE) IMPLANT
CLAMP CORD UMBIL (MISCELLANEOUS) IMPLANT
CLOTH BEACON ORANGE TIMEOUT ST (SAFETY) ×3 IMPLANT
CONTAINER PREFILL 10% NBF 15ML (MISCELLANEOUS) ×6 IMPLANT
DERMABOND ADVANCED (GAUZE/BANDAGES/DRESSINGS) ×2
DERMABOND ADVANCED .7 DNX12 (GAUZE/BANDAGES/DRESSINGS) ×1 IMPLANT
DRAPE LG THREE QUARTER DISP (DRAPES) IMPLANT
DRSG OPSITE POSTOP 4X10 (GAUZE/BANDAGES/DRESSINGS) ×3 IMPLANT
DRSG VAC ATS LRG SENSATRAC (GAUZE/BANDAGES/DRESSINGS) IMPLANT
DRSG VAC ATS MED SENSATRAC (GAUZE/BANDAGES/DRESSINGS) ×2 IMPLANT
DRSG VAC ATS SM SENSATRAC (GAUZE/BANDAGES/DRESSINGS) IMPLANT
DURAPREP 26ML APPLICATOR (WOUND CARE) ×3 IMPLANT
ELECT REM PT RETURN 9FT ADLT (ELECTROSURGICAL) ×3
ELECTRODE REM PT RTRN 9FT ADLT (ELECTROSURGICAL) ×1 IMPLANT
EXTRACTOR VACUUM M CUP 4 TUBE (SUCTIONS) IMPLANT
EXTRACTOR VACUUM M CUP 4' TUBE (SUCTIONS)
GLOVE BIO SURGEON STRL SZ8 (GLOVE) ×6 IMPLANT
GOWN STRL REUS W/TWL LRG LVL3 (GOWN DISPOSABLE) ×6 IMPLANT
KIT ABG SYR 3ML LUER SLIP (SYRINGE) IMPLANT
NDL HYPO 25X5/8 SAFETYGLIDE (NEEDLE) ×1 IMPLANT
NEEDLE HYPO 25X5/8 SAFETYGLIDE (NEEDLE) ×3 IMPLANT
NS IRRIG 1000ML POUR BTL (IV SOLUTION) ×3 IMPLANT
PACK C SECTION WH (CUSTOM PROCEDURE TRAY) ×3 IMPLANT
PAD OB MATERNITY 4.3X12.25 (PERSONAL CARE ITEMS) ×3 IMPLANT
RTRCTR C-SECT PINK 25CM LRG (MISCELLANEOUS) ×3 IMPLANT
STAPLER VISISTAT 35W (STAPLE) IMPLANT
SUT GUT PLAIN 0 CT-3 TAN 27 (SUTURE) ×2 IMPLANT
SUT MNCRL 0 VIOLET CTX 36 (SUTURE) ×3 IMPLANT
SUT MNCRL AB 4-0 PS2 18 (SUTURE) IMPLANT
SUT MON AB 2-0 CT1 27 (SUTURE) ×3 IMPLANT
SUT MON AB 3-0 SH 27 (SUTURE)
SUT MON AB 3-0 SH27 (SUTURE) IMPLANT
SUT MONOCRYL 0 CTX 36 (SUTURE) ×6
SUT PDS AB 0 CTX 60 (SUTURE) IMPLANT
SUT PLAIN 2 0 XLH (SUTURE) IMPLANT
SUT VIC AB 0 CTX 36 (SUTURE)
SUT VIC AB 0 CTX36XBRD ANBCTRL (SUTURE) IMPLANT
SUT VIC AB 2-0 CT1 27 (SUTURE)
SUT VIC AB 2-0 CT1 TAPERPNT 27 (SUTURE) IMPLANT
TOWEL OR 17X24 6PK STRL BLUE (TOWEL DISPOSABLE) ×3 IMPLANT
TRAY FOLEY CATH 14FR (SET/KITS/TRAYS/PACK) ×3 IMPLANT
WATER STERILE IRR 1000ML POUR (IV SOLUTION) ×3 IMPLANT

## 2014-07-18 NOTE — Transfer of Care (Signed)
Immediate Anesthesia Transfer of Care Note  Patient: Cindy Gardner  Procedure(s) Performed: Procedure(s): REPEAT CESAREAN SECTION (N/A)  Patient Location: PACU  Anesthesia Type:Spinal  Level of Consciousness: awake, alert  and oriented  Airway & Oxygen Therapy: Patient Spontanous Breathing  Post-op Assessment: Report given to PACU RN and Post -op Vital signs reviewed and stable  Post vital signs: Reviewed and stable  Complications: No apparent anesthesia complications

## 2014-07-18 NOTE — H&P (Signed)
Cindy Gardner is an 28 y.o. female. Previous C/S.  Desires repeat C/S.  Pertinent Gynecological History: Menses: n/a Bleeding: none Contraception: none DES exposure: denies Blood transfusions: none Sexually transmitted diseases: no past history Previous GYN Procedures: none  Last mammogram: n/a Date: n/a Last pap: normal Date: 2014 OB History: G6, P1405   Menstrual History: Menarche age: not asked Patient's last menstrual period was 11/02/2013.    Past Medical History  Diagnosis Date  . Pregnancy induced hypertension   . Depression   . Anxiety     Past Surgical History  Procedure Laterality Date  . No past surgeries    . Cesarean section  11/20/2011    Procedure: CESAREAN SECTION;  Surgeon: Kathreen Cosier, MD;  Location: [facility];  Service: Gynecology;  Laterality: N/A;    Family History  Problem Relation Age of Onset  . Anesthesia problems Neg Hx     Social History:  reports that she has been smoking Cigarettes.  She has been smoking about 1.00 pack per day. She has never used smokeless tobacco. She reports that she does not drink alcohol or use illicit drugs.  Allergies: No Known Allergies   (Not in a hospital admission)  Review of Systems  All other systems reviewed and are negative.   Last menstrual period 11/02/2013. Physical Exam  Constitutional: She is oriented to person, place, and time. She appears well-developed and well-nourished.  HENT:  Head: Normocephalic and atraumatic.  Eyes: Conjunctivae are normal. Pupils are equal, round, and reactive to light.  Neck: Normal range of motion. Neck supple.  Cardiovascular: Normal rate and regular rhythm.   Respiratory: Effort normal and breath sounds normal.  GI: Soft.  Musculoskeletal: Normal range of motion.  Neurological: She is alert and oriented to person, place, and time.  Skin: Skin is warm and dry.  Psychiatric: She has a normal mood and affect. Her behavior is normal. Judgment and thought  content normal.    No results found for this or any previous visit (from the past 24 hour(s)).  No results found.  Assessment/Plan: Previous C/S.  Desires repeat C/S.  Antoinett Dorman A 07/18/2014, 1:02 PM

## 2014-07-18 NOTE — Lactation Note (Signed)
This note was copied from the chart of Cindy Gardner. Lactation Consultation Note  Patient Name: Cindy Gardner [id number] Date: 07/18/2014 Reason for consult: Other (Comment) (charting for exclusion)   Maternal Data Formula Feeding for Exclusion: Yes Reason for exclusion: Mother's choice to formula feed on admision  Feeding Feeding Type: Bottle Fed - Formula Length of feed: 5 min  LATCH Score/Interventions                      Lactation Tools Discussed/Used     Consult Status Consult Status: Complete    Lynda Rainwater 07/18/2014, 4:24 PM

## 2014-07-18 NOTE — Op Note (Signed)
Cesarean Section Procedure Note   Cindy Gardner   07/18/2014  Indications: Scheduled Proceedure/Maternal Request   Pre-operative Diagnosis: Repeat C section [address].   Post-operative Diagnosis: Same   Surgeon: Coral Ceo A  Assistants: Tamela Oddi, Wilmer Floor.  Anesthesia: spinal  Procedure Details:  The patient was seen in the Holding Room. The risks, benefits, complications, treatment options, and expected outcomes were discussed with the patient. The patient concurred with the proposed plan, giving informed consent. The patient was identified as [name] and the procedure verified as C-Section Delivery. A Time Out was held and the above information confirmed.  After induction of anesthesia, the patient was draped and prepped in the usual sterile manner. A transverse incision was made and carried down through the subcutaneous tissue to the fascia. The fascial incision was made and extended transversely. The fascia was separated from the underlying rectus tissue superiorly and inferiorly. The peritoneum was identified and entered. The peritoneal incision was extended longitudinally. The utero-vesical peritoneal reflection was incised transversely and the bladder flap was bluntly freed from the lower uterine segment. A low transverse uterine incision was made. Delivered from cephalic presentation was a 3425 gram living newborn female infant(s). APGAR (1 MIN): 6   APGAR (5 MINS): 8   APGAR (10 MINS):    A cord ph was not sent. The umbilical cord was clamped and cut cord. A sample was obtained for evaluation. The placenta was removed Intact and appeared normal.  The uterine incision was closed with running locked sutures of 1-0 Monocryl. A second imbricating layer of the same suture was placed.  Hemostasis was observed. The paracolic gutters were irrigated. The parieto peritoneum was closed in a running fashion with 2-0 Vicryl.  The fascia was then reapproximated with running  sutures of 0 Vicryl.  The skin was closed with staples.  Instrument, sponge, and needle counts were correct prior the abdominal closure and were correct at the conclusion of t  he case.    Findings:  Normal uterus, ovaries and tubes.   Estimated Blood Loss:   Total IV Fluids:  Urine Output: 30CC OF clear urine  Specimens: Placenta to L&D  Complications: no complications  Disposition: PACU - hemodynamically stable.  Maternal Condition: stable   Baby condition / location:  Couplet care / Skin to Skin    Signed: Surgeon(s): Brock Bad, MD Antionette Char, MD

## 2014-07-18 NOTE — Anesthesia Postprocedure Evaluation (Signed)
  Anesthesia Post Note  Patient: Cindy Gardner  Procedure(s) Performed: Procedure(s) (LRB): REPEAT CESAREAN SECTION (N/A)  Anesthesia type: Spinal  Patient location: PACU  Post pain: Pain level controlled  Post assessment: Post-op Vital signs reviewed  Last Vitals:  Filed Vitals:   07/18/14 1730  BP: 120/70  Pulse: 65  Temp:   Resp: 17    Post vital signs: Reviewed  Level of consciousness: awake  Complications: No apparent anesthesia complications

## 2014-07-18 NOTE — Anesthesia Procedure Notes (Signed)
Spinal  Patient location during procedure: OR Start time: 07/18/2014 2:05 PM Staffing Anesthesiologist: Brayton Caves Performed by: anesthesiologist  Preanesthetic Checklist Completed: patient identified, site marked, surgical consent, pre-op evaluation, timeout performed, IV checked, risks and benefits discussed and monitors and equipment checked Spinal Block Patient position: sitting Prep: DuraPrep Patient monitoring: heart rate, cardiac monitor, continuous pulse ox and blood pressure Approach: midline Location: L3-4 Injection technique: single-shot Needle Needle type: Sprotte  Needle gauge: 24 G Needle length: 9 cm Assessment Sensory level: T4 Additional Notes Patient identified.  Risk benefits discussed including failed block, incomplete pain control, headache, nerve damage, paralysis, blood pressure changes, nausea, vomiting, reactions to medication both toxic or allergic, and postpartum back pain.  Patient expressed understanding and wished to proceed.  All questions were answered.  Sterile technique used throughout procedure.  CSF was clear.  No parasthesia or other complications.  Please see nursing notes for vital signs.

## 2014-07-18 NOTE — Brief Op Note (Signed)
07/18/2014  4:48 PM  PATIENT:  Cindy Gardner  28 y.o. female  PRE-OPERATIVE DIAGNOSIS:  Repeat C section [phone]  POST-OPERATIVE DIAGNOSIS:  Repeat C section [phone]  PROCEDURE:  Procedure(s): REPEAT CESAREAN SECTION (N/A)  SURGEON:  Surgeon(s) and Role:    * Brock Bad, MD - Primary    * Antionette Char, MD - Assisting  PHYSICIAN ASSISTANT:   ASSISTANTS: none   ANESTHESIA:   spinal  EBL:  Total I/O In: 2500 [I.V.:2500] Out: 920 [Urine:30; Blood:890]  BLOOD ADMINISTERED:none  DRAINS: Urinary Catheter (Foley)   LOCAL MEDICATIONS USED:  NONE  SPECIMEN:  No Specimen  DISPOSITION OF SPECIMEN:  N/A  COUNTS:  YES  TOURNIQUET:  None  DICTATION: .Note written in EPIC  PLAN OF CARE: Admit to inpatient   PATIENT DISPOSITION:  PACU - hemodynamically stable.   Delay start of Pharmacological VTE agent (>24hrs) due to surgical blood loss or risk of bleeding: not applicable

## 2014-07-19 ENCOUNTER — Encounter (HOSPITAL_COMMUNITY): Payer: Self-pay | Admitting: Obstetrics

## 2014-07-19 LAB — CBC
HCT: 30 % — ABNORMAL LOW (ref 36.0–46.0)
Hemoglobin: 10 g/dL — ABNORMAL LOW (ref 12.0–15.0)
MCH: 28.5 pg (ref 26.0–34.0)
MCHC: 33.3 g/dL (ref 30.0–36.0)
MCV: 85.5 fL (ref 78.0–100.0)
PLATELETS: 118 10*3/uL — AB (ref 150–400)
RBC: 3.51 MIL/uL — ABNORMAL LOW (ref 3.87–5.11)
RDW: 13.5 % (ref 11.5–15.5)
WBC: 20.3 10*3/uL — ABNORMAL HIGH (ref 4.0–10.5)

## 2014-07-19 LAB — BIRTH TISSUE RECOVERY COLLECTION (PLACENTA DONATION)

## 2014-07-19 MED ORDER — NALBUPHINE HCL 10 MG/ML IJ SOLN
10.0000 mg | Freq: Four times a day (QID) | INTRAMUSCULAR | Status: DC | PRN
  Administered 2014-07-19 – 2014-07-20 (×3): 10 mg via INTRAMUSCULAR
  Filled 2014-07-19 (×3): qty 1

## 2014-07-19 MED ORDER — DIPHENHYDRAMINE HCL 25 MG PO CAPS
50.0000 mg | ORAL_CAPSULE | Freq: Four times a day (QID) | ORAL | Status: DC | PRN
  Administered 2014-07-19: 25 mg via ORAL
  Administered 2014-07-19: 50 mg via ORAL
  Filled 2014-07-19: qty 2

## 2014-07-19 NOTE — Progress Notes (Signed)
UR chart review completed.  

## 2014-07-19 NOTE — Progress Notes (Signed)
Subjective: Postpartum Day 1: Cesarean Delivery Patient reports tolerating PO.    Objective: Vital signs in last 24 hours: Temp:  [97.9 F (36.6 C)-98.4 F (36.9 C)] 98.2 F (36.8 C) (09/11 0340) Pulse Rate:  [56-89] 64 (09/11 0340) Resp:  [14-20] 18 (09/11 0340) BP: (95-141)/(53-91) 118/55 mmHg (09/11 0340) SpO2:  [96 %-100 %] 100 % (09/11 0340) Weight:  [210 lb (95.255 kg)] 210 lb (95.255 kg) (09/10 1900)  Physical Exam:  General: alert and no distress Lochia: appropriate Uterine Fundus: firm Incision: healing well DVT Evaluation: No evidence of DVT seen on physical exam.   Recent Labs  07/17/14 1035 07/19/14 0621  HGB 12.0 10.0*  HCT 36.3 30.0*    Assessment/Plan: Status post Cesarean section. Doing well postoperatively.  Continue current care.  HARPER,CHARLES A 07/19/2014, 8:17 AM

## 2014-07-19 NOTE — Addendum Note (Signed)
Addendum created 07/19/14 0803 by Lincoln Brigham, CRNA   Modules edited: Charges VN, Notes Section   Notes Section:  File: [id number]

## 2014-07-19 NOTE — Anesthesia Postprocedure Evaluation (Signed)
Anesthesia Post Note  Patient: Cindy Gardner  Procedure(s) Performed: Procedure(s) (LRB): REPEAT CESAREAN SECTION (N/A)  Anesthesia type: spinal  Patient location: Mother/Baby  Post pain: Pain level controlled  Post assessment: Post-op Vital signs reviewed  Last Vitals:  Filed Vitals:   07/19/14 0340  BP: 118/55  Pulse: 64  Temp: 36.8 C  Resp: 18    Post vital signs: Reviewed  Level of consciousness:alert  Complications: No apparent anesthesia complications

## 2014-07-19 NOTE — Progress Notes (Signed)
Clinical Social Work Department PSYCHOSOCIAL ASSESSMENT - MATERNAL/CHILD 07/19/2014  Patient:  Ells,Mayana L  Account Number:  [id number]  Admit Date:  07/18/2014  Childs Name:   Milaysa   Clinical Social Worker:  Nahzir Pohle, CLINICAL SOCIAL WORKER   Date/Time:  07/19/2014 01:45 PM  Date Referred:  07/18/2014   Referral source  Central Nursery     Referred reason  Depression/Anxiety   Other referral source:    I:  FAMILY / HOME ENVIRONMENT Child's legal guardian:  PARENT  Guardian - Name Guardian - Age Guardian - Address  [address] [place], [place] [address]   Other household support members/support persons Name Relationship DOB  Angela FRIEND     4 years old    2 years old   Other support:   Per MOB, she has three older children (15,13,and 11) who live with the PGM.  She stated that she has daily contact with these children. Her family lives in [place], but she shared that she has created a support system in [place] that include a few close friends.    II  PSYCHOSOCIAL DATA Information Source:  Patient Interview  Financial and Community Resources Employment:   MOB works 3rd shift at [employer].   Financial resources:  Medicaid If Medicaid - County:  [place] Other  Food Stamps  WIC   School / Grade:  N/A Maternity Care Coordinator / Child Services Coordination / Early Interventions:   CSW to make referral for CC4C.  Cultural issues impacting care:   None reported    III  STRENGTHS Strengths  Adequate Resources  Home prepared for Child (including basic supplies)  Supportive family/friends   Strength comment:    IV  RISK FACTORS AND CURRENT PROBLEMS Current Problem:  YES   Risk Factor & Current Problem Patient Issue Family Issue Risk Factor / Current Problem Comment  Mental Illness Y N MOB reported long history of anxiety and depression. She currently is prescribed Zoloft but does not believe that her symptoms are  controlled. She reported that during her pregnancy she was tearful and how levels of motivation.  She also shared belief that she felt like rooms were closing in on her.    V  SOCIAL WORK ASSESSMENT CSW met with the MOB in order to complete the assessment. Consult was ordered due to MOB presenting with a history of anxiety and depression.  MOB originally presented as guarded and minimally interested in the CSW visit, but as rapport was built, MOB became more receptive.  She became appropriately tearful as she discussed her psychosocial stressors and willingly processed her stressors with the CSW.  MOB was observed to be tired and drowsy, but MOB recently received pain medication.    Per MOB, the home is prepared for their transition home, and that she is receiving support from family and friends as she transitions into the postpartum period.  She stated that her sister and her mother (who is visiting from [place]) are assisting her to care for her 2 and 4 year old children who live in the home with her.  As CSW explored psychosocial stressors, MOB shared that she and the FOB ended their relationship 2 months ago.  MOB became tearful as she expressed the feelings of hurt and anger she continues to feel since she believes the FOB mislead her about how he felt about her and that he just left her as she transitions to having a newborn again.  MOB shared that she   sometimes feels as if she is unable to take care of herself since she needs to place the children's needs first, and she is frustrated that she is unable to "do me first" and the FOB is able to put his own needs first  MOB became tearful as the baby started to cry, and CSW inquired about how she is currently feeling.  MOB stated that she does feel overwhelmed and that she is reminded about the loss of the FOB when she looks at him.  CSW validated her feelings of anger and loss, and continued to assist MOB process her feelings of being overwhelmed.   MOB stated that she loves the baby, but stated that the baby was born "at the wrong time".  CSW continued to validate MOB's feelings and validated the conflicting feelings she feels toward the baby.  CSW explored with MOB how she has been coping and how she needs to cope with these feelings as she moves forward.  MOB stated that she likes to journal since it allows herself to express her feelings.  MOB recognizes pattern of internalizing feelings, and with prompting, was able to identify and reflect upon how internalization of feelings is maladaptive.  MOB stated that she can talk to friends about her feelings, and she recognized that she will need to continue to talk about her feelings as she is at risk of being even more overwhelmed now that she has a newborn again.    CSW inquired about MOB's values and what is important to her in her life.  MOB stated that her children are her number one priority.  CSW explored with MOB how values can be used to guide her decision making process.  MOB was able to verbalize how she can use her children as motivation to talk about her feelings and to engage in daily self-care activities that will allow her to better care for herself and her children.    MOB confirmed that she is currently prescribed Zoloft.  She stated that she started medication 2 years ago following the birth of her 29 year old.  She stated that medication assisted with stabilization of depression, but she shared that her medication has not controlled her anxiety.  CSW noted that MOB is not prescribed an anti-anxiety medication, and she stated that she and her mother have discussed her asking about a prescription for an anti-anxiety medication in order to appropriately address her symptoms.  MOB confirmed that during her pregnancy, she felt tearful and had low levels of motivation. MOB also endorsed feeling as if the walls of the room were closing in on her.  CSW explored MOB's level of readiness to engage in  outpatient treatment.  MOB denied desire to receive a referral for therapy since she has had little success with therapy in the past.  MOB stated that she wants to talk to her PCP for medications instead of receiving a referral for a psychiatrist.    No barriers to discharge at this time.   VI SOCIAL WORK PLAN Social Work Plan  Information/Referral to Community Resources  Patient/Family Education  No Further Intervention Required / No Barriers to Discharge   Type of pt/family education:   Postpartum depression and anxiety   If child protective services report - county:   If child protective services report - date:   Information/referral to community resources comment:   CSW explored with MOB her desire to receive therapy. MOB denied desire.  She stated that she will go   to her PCP for medications and stated that she intends to talk to the MD about starting her on an anti-anxiety medication since she is currently only prescribed an anti-depressant.   Other social work plan:   CSW to provide ongoing emotional support PRN.

## 2014-07-20 MED ORDER — IBUPROFEN 600 MG PO TABS
600.0000 mg | ORAL_TABLET | Freq: Four times a day (QID) | ORAL | Status: DC | PRN
  Administered 2014-07-20: 600 mg via ORAL
  Filled 2014-07-20: qty 1

## 2014-07-20 MED ORDER — MEASLES, MUMPS & RUBELLA VAC ~~LOC~~ INJ
0.5000 mL | INJECTION | Freq: Once | SUBCUTANEOUS | Status: AC
  Administered 2014-07-20: 0.5 mL via SUBCUTANEOUS
  Filled 2014-07-20 (×2): qty 0.5

## 2014-07-20 NOTE — Progress Notes (Signed)
Patient ID: Cindy Gardner, female   DOB: 02/10/1986, 28 y.o.   MRN: [id number] Postop day 2 patient wants early discharge should be discharged and to see [facility] on Tuesday

## 2014-07-20 NOTE — Progress Notes (Signed)
Patient ID: Cindy Gardner, female   DOB: Mar 13, 1986, 28 y.o.   MRN: [id number] Postop day 2 Vital signs normal Fundus firm Lochia moderate Doing well

## 2014-07-20 NOTE — Progress Notes (Signed)
Patient told to follow up on Tues. With Dr. Verdell Carmine office to have Pica dressing removed per Dr. Gaynell Face .

## 2014-07-20 NOTE — Discharge Summary (Signed)
Obstetric Discharge Summary Reason for Admission: onset of labor Prenatal Procedures: none Intrapartum Procedures: cesarean: low cervical, transverse Postpartum Procedures: none Complications-Operative and Postpartum: none Hemoglobin  Date Value Ref Range Status  07/19/2014 10.0* 12.0 - 15.0 g/dL Final     HCT  Date Value Ref Range Status  07/19/2014 30.0* 36.0 - 46.0 % Final    Physical Exam:  General: alert Lochia: appropriate Uterine Fundus: firm Incision: healing well DVT Evaluation: No evidence of DVT seen on physical exam.  Discharge Diagnoses: Term Pregnancy-delivered  Discharge Information: Date: 07/20/2014 Activity: pelvic rest Diet: routine Medications: Percocet Condition: stable Instructions: refer to practice specific booklet Discharge to: home Follow-up Information   Follow up with HARPER,CHARLES A, MD In 3 days.   Specialty:  Obstetrics and Gynecology   Contact information:   [address] [place] [address] [phone]       Newborn Data: Live born female  Birth Weight: 7 lb 8.8 oz (3425 g) APGAR: 6, 8  Home with mother.  Tida Saner A 07/20/2014, 8:44 AM

## 2014-07-20 NOTE — Discharge Instructions (Signed)
Discharge instructions ° °· You can wash your hair °· Shower °· Eat what you want °· Drink what you want °· See me in 6 weeks °· Your ankles are going to swell more in the next 2 weeks than when pregnant °· No sex for 6 weeks ° ° °Lenox Ladouceur A, MD 07/20/2014 ° ° °

## 2014-07-23 ENCOUNTER — Other Ambulatory Visit: Payer: Self-pay | Admitting: Obstetrics

## 2014-07-23 ENCOUNTER — Ambulatory Visit (INDEPENDENT_AMBULATORY_CARE_PROVIDER_SITE_OTHER): Payer: Medicaid Other | Admitting: *Deleted

## 2014-07-23 DIAGNOSIS — R52 Pain, unspecified: Secondary | ICD-10-CM

## 2014-07-23 DIAGNOSIS — G8918 Other acute postprocedural pain: Secondary | ICD-10-CM

## 2014-07-23 DIAGNOSIS — O9089 Other complications of the puerperium, not elsewhere classified: Secondary | ICD-10-CM

## 2014-07-23 MED ORDER — OXYCODONE-ACETAMINOPHEN 10-325 MG PO TABS: 1.0000 | ORAL_TABLET | ORAL | Status: DC | PRN

## 2014-07-23 MED ORDER — IBUPROFEN 800 MG PO TABS: 800.0000 mg | ORAL_TABLET | Freq: Three times a day (TID) | ORAL | Status: DC | PRN

## 2014-07-23 MED ORDER — KETOROLAC TROMETHAMINE 60 MG/2ML IM SOLN
60.0000 mg | Freq: Once | INTRAMUSCULAR | Status: AC
  Administered 2014-07-23: 60 mg via INTRAMUSCULAR

## 2014-07-23 NOTE — Progress Notes (Signed)
Patient in the office today to discuss pain management after recent C-Section. Per Dr. Clearance Coots patient to be given Toradol 60 mg IM.

## 2014-07-30 ENCOUNTER — Ambulatory Visit (INDEPENDENT_AMBULATORY_CARE_PROVIDER_SITE_OTHER): Payer: Medicaid Other | Admitting: Obstetrics

## 2014-07-31 ENCOUNTER — Encounter: Payer: Self-pay | Admitting: Obstetrics

## 2014-07-31 NOTE — Progress Notes (Signed)
Subjective:     Cindy Gardner is a 28 y.o. female who presents for a postpartum visit. She is 2 weeks postpartum following a low cervical transverse Cesarean section. I have fully reviewed the prenatal and intrapartum course. The delivery was at 39 gestational weeks. Outcome: repeat cesarean section, low transverse incision. Anesthesia: spinal. Postpartum course has been normal. Baby's course has been normal. Baby is feeding by bottle Rush Barer. Bleeding thin lochia. Bowel function is normal. Bladder function is normal. Patient is not sexually active. Contraception method is abstinence. Postpartum depression screening: negative.  Tobacco, alcohol and substance abuse history reviewed.  Adult immunizations reviewed including TDAP, rubella and varicella.  The following portions of the patient's history were reviewed and updated as appropriate: allergies, current medications, past family history, past medical history, past social history, past surgical history and problem list.  Review of Systems A comprehensive review of systems was negative.   Objective:    BP 137/89  Pulse 60  Temp(Src) 98.4 F (36.9 C)  Wt 200 lb (90.719 kg)  LMP 11/02/2013  General:  alert and no distress   Breasts:  inspection negative, no nipple discharge or bleeding, no masses or nodularity palpable  Lungs: not evaluated  Heart:  normal apical impulse  Abdomen: normal findings: soft, non-tender and incision C, D, I.  Staples removed and steri strips applied.     Assessment:    Postpartum.  Doing well. Plan:    1. Contraception: Options discussed 2. Continue PNV's 3. Follow up in: 4 weeks or as needed.   Healthy lifestyle practices reviewed

## 2014-08-27 ENCOUNTER — Ambulatory Visit: Payer: Medicaid Other | Admitting: Obstetrics

## 2014-09-09 ENCOUNTER — Encounter: Payer: Self-pay | Admitting: Obstetrics

## 2014-10-23 ENCOUNTER — Encounter (HOSPITAL_COMMUNITY): Payer: Self-pay | Admitting: *Deleted

## 2014-10-23 ENCOUNTER — Emergency Department (HOSPITAL_COMMUNITY)
Admission: EM | Admit: 2014-10-23 | Discharge: 2014-10-23 | Disposition: A | Discharge status: Home / Self Care | Payer: Medicaid Other | Attending: Emergency Medicine | Admitting: Emergency Medicine

## 2014-10-23 DIAGNOSIS — L2481 Irritant contact dermatitis due to metals: Secondary | ICD-10-CM | POA: Diagnosis not present

## 2014-10-23 DIAGNOSIS — R21 Rash and other nonspecific skin eruption: Secondary | ICD-10-CM | POA: Diagnosis present

## 2014-10-23 DIAGNOSIS — Z8659 Personal history of other mental and behavioral disorders: Secondary | ICD-10-CM | POA: Diagnosis not present

## 2014-10-23 DIAGNOSIS — L259 Unspecified contact dermatitis, unspecified cause: Secondary | ICD-10-CM

## 2014-10-23 DIAGNOSIS — Z720 Tobacco use: Secondary | ICD-10-CM | POA: Diagnosis not present

## 2014-10-23 MED ORDER — HYDROCORTISONE 1 % EX LOTN: 1.0000 "application " | TOPICAL_LOTION | Freq: Two times a day (BID) | CUTANEOUS | Status: DC

## 2014-10-23 MED ORDER — PREDNISONE 20 MG PO TABS: 40.0000 mg | ORAL_TABLET | Freq: Every day | ORAL | Status: DC

## 2014-10-23 NOTE — ED Provider Notes (Signed)
CSN: [id number]     Arrival date & time 10/23/14  2236 History  This chart was scribed for non-physician practitioner, Mellody DrownLauren Tammy Ericsson, PA-C, working with Suzi RootsKevin E Steinl, MD by Milly JakobJohn Lee Graves, ED Scribe. The patient was seen in room WTR8/WTR8. Patient's care was started at 11:04 PM.  Chief Complaint  Patient presents with  . Rash   HPI Comments: Cindy Gardner is a 28 y.o. female who presents to the Emergency Department complaining of a painful, itchy, rash on her face, neck, chest, and back which began 5 days ago. She states that it began on her neck when she was wearing a costume jewelry necklace and then spread to her chest, back, and face. She denies any new soap, lotion, or makeup, medications. Pt reports using a hot compress which cased the rash to drain and burn without alleviate it. She denies any additional medical problems.  PCP: Dr. Clearance CootsHarper   The history is provided by the patient. No language interpreter was used.    Past Medical History  Diagnosis Date  . Pregnancy induced hypertension   . Anxiety   . Depression    Past Surgical History  Procedure Laterality Date  . No past surgeries    . Cesarean section  11/20/2011    Procedure: CESAREAN SECTION;  Surgeon: Kathreen CosierBernard A Marshall, MD;  Location: [facility];  Service: Gynecology;  Laterality: N/A;  . Cesarean section N/A 07/18/2014    Procedure: REPEAT CESAREAN SECTION;  Surgeon: Brock Badharles A Harper, MD;  Location: [facility];  Service: Obstetrics;  Laterality: N/A;   Family History  Problem Relation Age of Onset  . Anesthesia problems Neg Hx    History  Substance Use Topics  . Smoking status: Current Every Day Smoker -- 1.00 packs/day    Types: Cigarettes  . Smokeless tobacco: Never Used  . Alcohol Use: No   OB History    Gravida Para Term Preterm AB TAB SAB Ectopic Multiple Living   6 6 2 4      6      Review of Systems  Constitutional: Negative for fever and chills.  Skin: Positive for rash.   Allergies  Review of  patient's allergies indicates no known allergies.  Home Medications   Prior to Admission medications   Medication Sig Start Date End Date Taking? Authorizing Provider  ibuprofen (ADVIL,MOTRIN) 800 MG tablet Take 1 tablet (800 mg total) by mouth every 8 (eight) hours as needed. 07/23/14   Brock Badharles A Harper, MD  oxyCODONE-acetaminophen (PERCOCET) 10-325 MG per tablet Take 1 tablet by mouth every 4 (four) hours as needed for pain. 07/23/14   Brock Badharles A Harper, MD   Triage Vitals: BP 142/73 mmHg  Pulse 74  Temp(Src) 98.4 F (36.9 C) (Oral)  Resp 19  SpO2 96%  LMP 10/02/2014 Physical Exam  Constitutional: She is oriented to person, place, and time. She appears well-developed and well-nourished.  Non-toxic appearance. She does not have a sickly appearance. She does not appear ill. No distress.  HENT:  Head: Normocephalic and atraumatic.  Neck: Neck supple.  Cardiovascular: Normal rate and regular rhythm.   Pulmonary/Chest: Effort normal. No respiratory distress.  Abdominal: Soft.  Neurological: She is alert and oriented to person, place, and time.  Skin: Skin is warm and dry. Rash noted.  Raised erythemic rash to neck and face. Multiple raised bumps and necklace configuration around neck. Multiple single lesions to the face, with yellow scabs, no erythema.  Psychiatric: She has a normal mood and affect.  Her behavior is normal.  Nursing note and vitals reviewed.   ED Course  Procedures (including critical care time)   COORDINATION OF CARE: 11:09 PM-Discussed treatment plan which includes prednisone and cortisone cream with pt at bedside and pt agreed to plan.   Labs Review Labs Reviewed - No data to display  Imaging Review No results found.   EKG Interpretation None      MDM   Final diagnoses:  Contact dermatitis   Patient with rash to face and neck, afebrile, rash consistent with contact dermatitis likely secondary to nickel or other metal in jewelry. Plan to treat with  steroid due to eruptions on face, topical steroid, Benadryl. Follow-up with PCP or dermatologist. No sign of secondary infection.   I personally performed the services described in this documentation, which was scribed in my presence. The recorded information has been reviewed and is accurate.    Mellody DrownLauren Soriah Leeman, PA-C 10/23/14 [id number]  Suzi RootsKevin E Steinl, MD 10/25/14 [phone]

## 2014-10-23 NOTE — ED Notes (Signed)
Pt ambulating independently w/ steady gait on d/c in no acute distress, A&Ox4. Rx given x2  

## 2014-10-23 NOTE — ED Notes (Signed)
Pt reports painful raised rash all over her face and around her neck.  She also has them on her L buttock since Saturday.  Pt reports soaking then with hot compress and then rash would drain purulent drainage.  Pt reports pain is becoming severe.  Pt also reports severe itching with burning sensation.  Denies using new soap or lotion at this time.

## 2014-10-23 NOTE — Discharge Instructions (Signed)
Call for a follow up appointment with a Family or Primary Care Provider or a dermatologist for further evaluation of your rash. Avoid scratching the rash, wash once a day with mild soap and blot dry. Return if Symptoms worsen.   Take medication as prescribed.  Benadryl over the counter

## 2015-03-21 IMAGING — US US OB COMP +14 WK
1 series · 12 of 28 positions shown · non-contrast
Comparison: none

OBSTETRICS REPORT
                      (Signed Final 06/11/2014 [DATE])

Service(s) Provided
 US OB COMP + 14 WK                                    76805.1
Indications
 Size greater than dates (Large for gestational [AGE]
 Previous pre-term deliveries x 4.  23, 32, 34, and
 30 wks
 Previous cesarean section x 1 - most recent
 pregnancy
 Cigarette smoker 1 ppd
 Hypertension - Gestational - history of
Fetal Evaluation
 Num Of Fetuses:    1
 Fetal Heart Rate:  146                          bpm
 Cardiac Activity:  Observed
 AFI Sum:     18.46   cm       68  %Tile     Larg Pckt:    7.15  cm
 RUQ:   7.15    cm   RLQ:    1.73   cm    LUQ:   6.14    cm   LLQ:    3.44   cm
Biometry
 BPD:     79.4  mm     G. Age:  31w 6d                CI:        75.12   70 - 86
                                                      FL/HC:      23.7   19.4 -
 HC:     290.6  mm     G. Age:  32w 0d      < 3  %    HC/AC:      0.90   0.96 -
 AC:     323.8  mm     G. Age:  36w 2d       93  %    FL/BPD:     86.8   71 - 87
 FL:      68.9  mm     G. Age:  35w 2d       66  %    FL/AC:      21.3   20 - 24
 Est. FW:    9944  gm    5 lb 11 oz      74  %
Gestational Age
 LMP:           34w 3d        Date:  10/13/13                 EDD:   07/20/14
 U/S Today:     33w 6d                                        EDD:   07/24/14
 Best:          34w 3d     Det. By:  Early Ultrasound         EDD:   07/20/14
                                     (03/26/14)
Anatomy
 Cranium:          Appears normal         Aortic Arch:      Not well visualized
 Fetal Cavum:      Appears normal         Ductal Arch:      Not well visualized
 Ventricles:       Appears normal         Diaphragm:        Appears normal
 Choroid Plexus:   Not well visualized    Stomach:          Appears normal, left
                                                            sided
 Cerebellum:       Not well visualized    Abdomen:          Appears normal
 Posterior Fossa:  Not well visualized    Abdominal Wall:   Not well visualized
 Nuchal Fold:      Not applicable (>20    Cord Vessels:     Appears normal (3
                   wks GA)                                  vessel cord)
 Face:             Not well visualized    Kidneys:          Appear normal
 Lips:             Not well visualized    Bladder:          Appears normal
 Heart:            Appears normal         Spine:            Appears normal
                   (4CH, axis, and
                   situs)
 RVOT:             Not well visualized    Lower             Appears normal
                                          Extremities:
 LVOT:             Not well visualized    Upper             Appears normal
 Other:  Gender not well visualized. Technically difficult due to advanced GA
         and fetal position. Ankles and hands not well visualized.
Cervix Uterus Adnexa
 Cervix:       Not visualized (advanced GA >69wks)
 Left Ovary:    Size(cm) L: 2.74 x W: 1.46 x H: 1.85  Volume(cc):
 Right Ovary:   Size(cm) L: 2.89 x W: 1.93 x H: 1.31  Volume(cc):
Impression
INDICATION: 27 yr old [id number] at 26w2d with history of
 preterm deliveries for fetal anatomic survey. Remote read.

[Series 1: us ob comp +14 wk · 70 acquisitions, 12 frames shown]
[im 3/70]
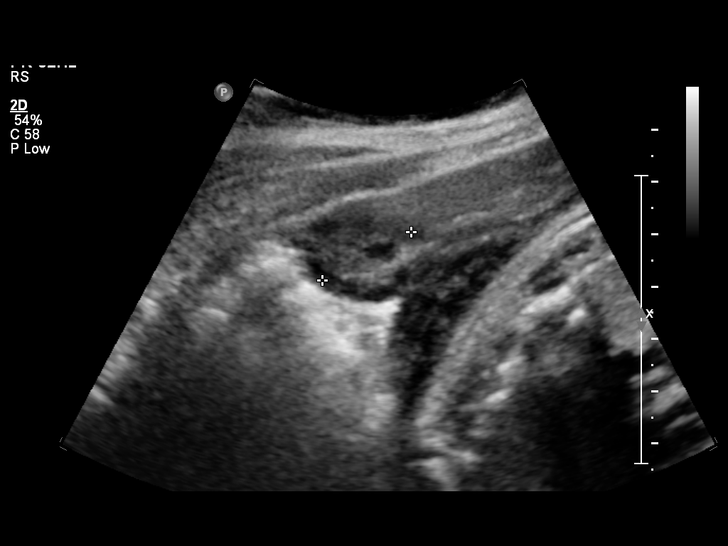
[im 8/70]
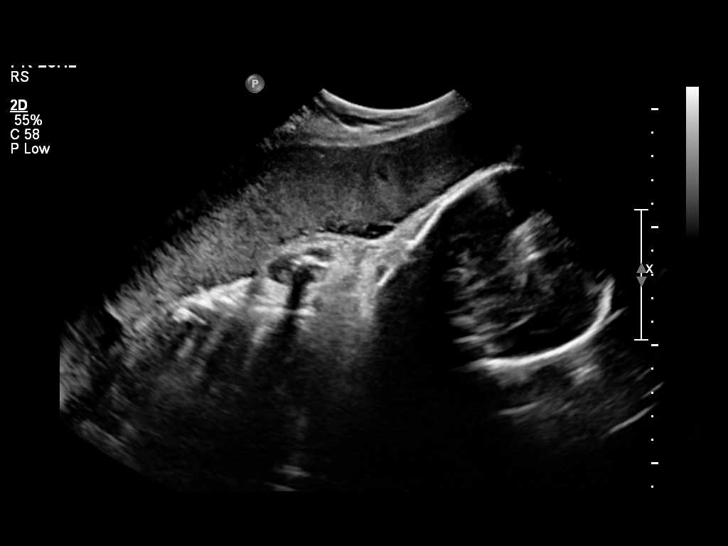
[im 13/70]
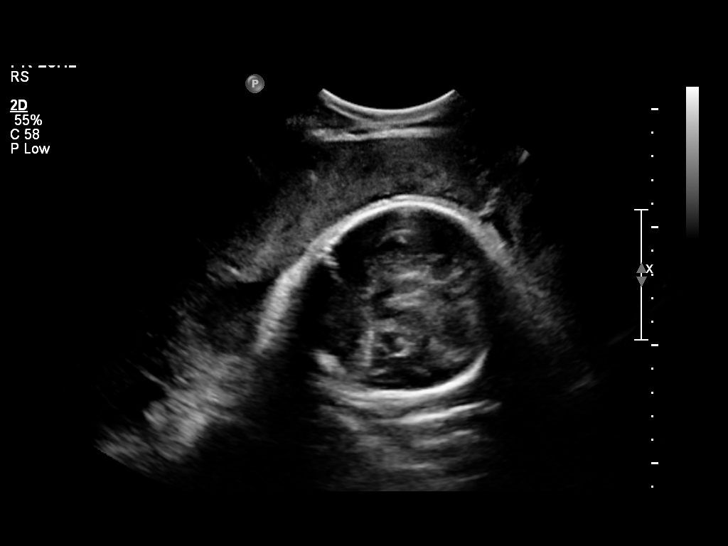
[im 21/70]
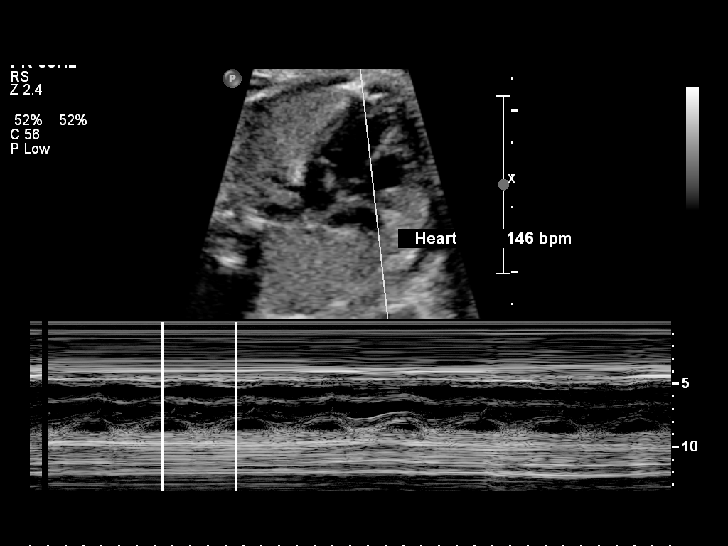
[im 26/70]
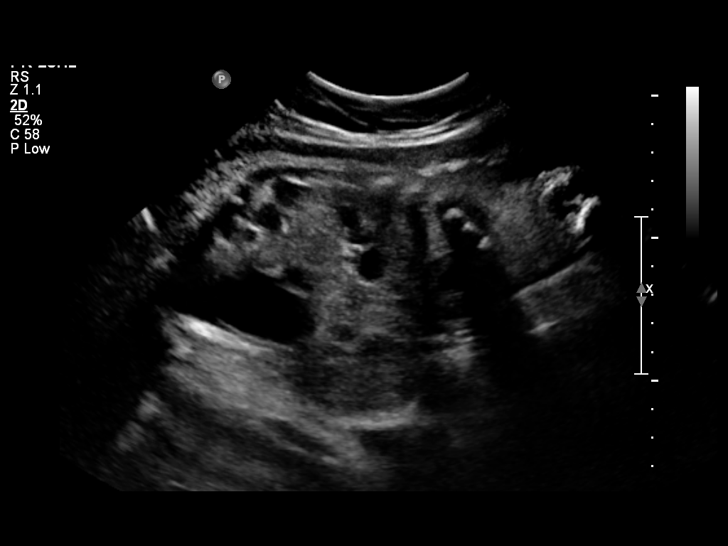
[im 31/70]
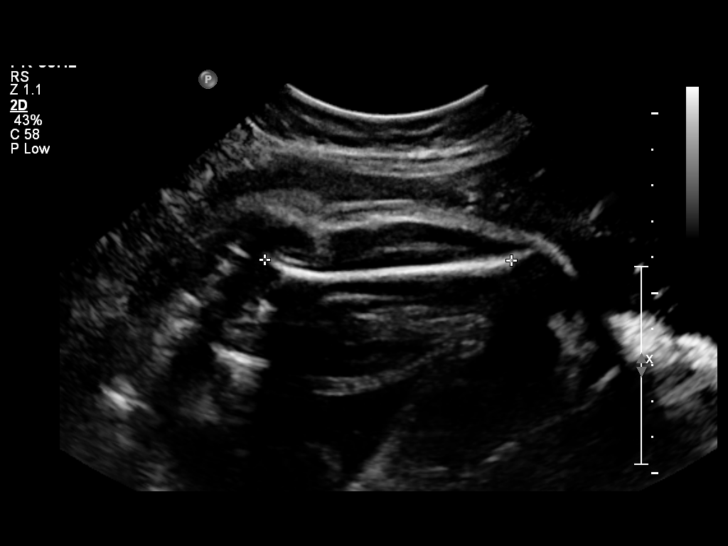
[im 39/70]
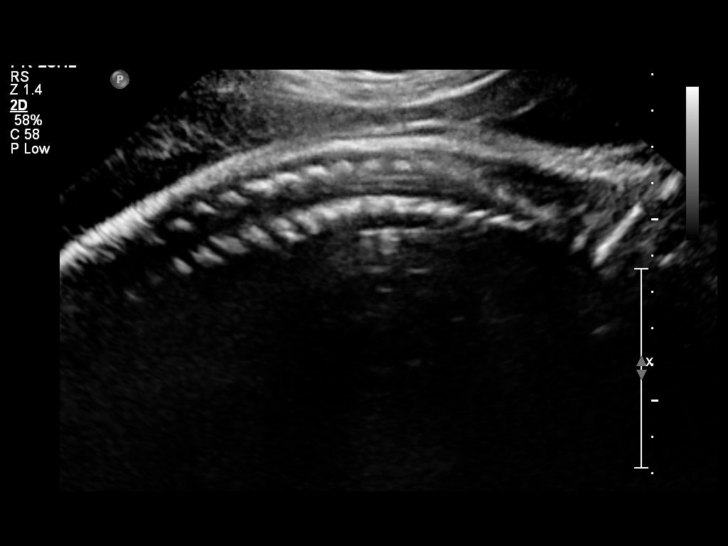
[im 44/70]
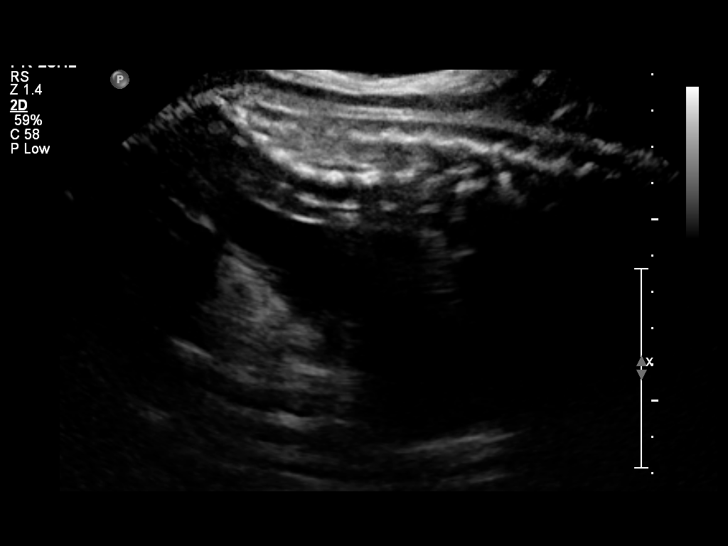
[im 49/70]
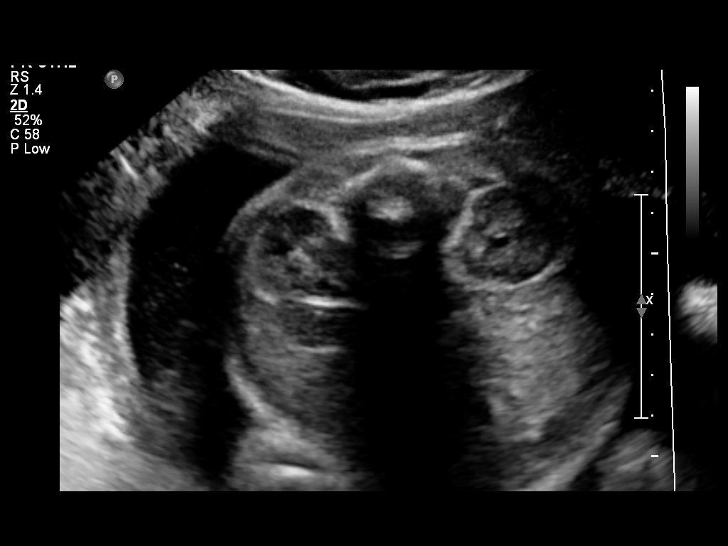
[im 57/70]
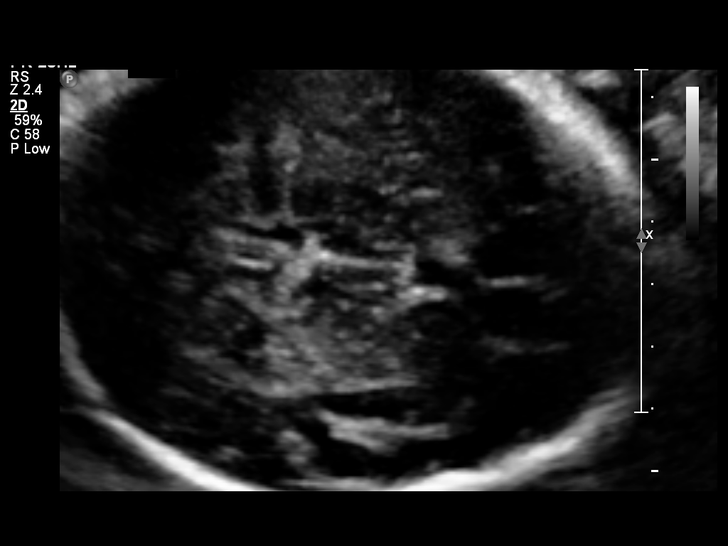
[im 62/70]
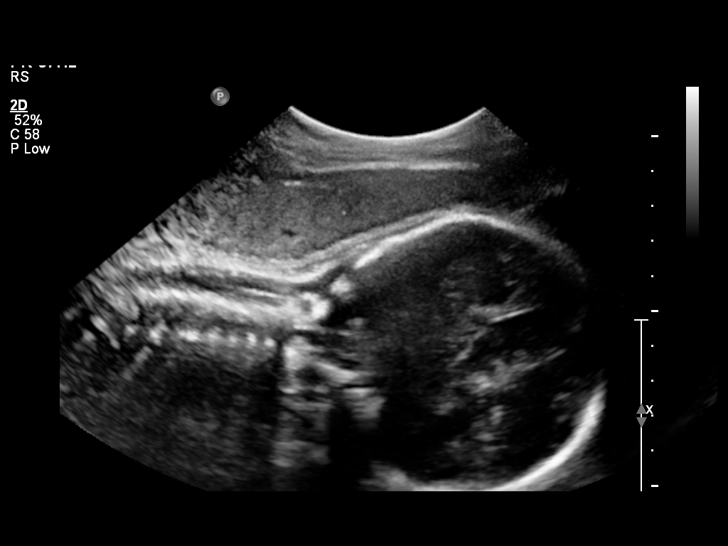
[im 67/70]
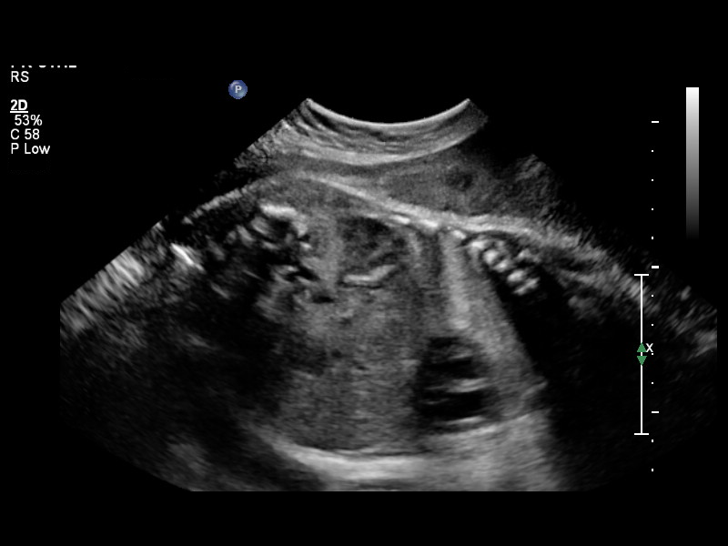

[12 of 28 positions shown; findings below may reference images not displayed]

FINDINGS: 1. Single intrauterine pregnancy.
 2. Estimated fetal weight is in the 74th%. The head
 circumference is in the <3rd%.
 3. Anterior placenta without evidence of previa.
 4. Normal amniotic fluid index.
 5. The anatomy survey is limited as above by advanced
 gestational age and fetal position; no abnormalities are seen.
Recommendations

 1. Overall appropriate fetal growth:
 - heard circumference is lagging but is not >2 SD below the
 mean
 - recommend reevaluate fetal growth in 3-4 weeks
 2. Normal limited anatomy survey
 - reevaluate on follow up ultrasound but likely will remain
 limited due to advanced gestational age
 3. History of preterm deliveries:
 - recommend preterm labor precautions

## 2015-03-31 ENCOUNTER — Encounter (HOSPITAL_COMMUNITY): Payer: Self-pay | Admitting: *Deleted

## 2015-03-31 ENCOUNTER — Emergency Department (HOSPITAL_COMMUNITY)
Admission: EM | Admit: 2015-03-31 | Discharge: 2015-04-01 | Disposition: A | Discharge status: Home / Self Care | Payer: No Typology Code available for payment source | Attending: Emergency Medicine | Admitting: Emergency Medicine

## 2015-03-31 ENCOUNTER — Emergency Department (HOSPITAL_COMMUNITY): Payer: No Typology Code available for payment source

## 2015-03-31 DIAGNOSIS — O9089 Other complications of the puerperium, not elsewhere classified: Secondary | ICD-10-CM

## 2015-03-31 DIAGNOSIS — Y998 Other external cause status: Secondary | ICD-10-CM | POA: Insufficient documentation

## 2015-03-31 DIAGNOSIS — Z3202 Encounter for pregnancy test, result negative: Secondary | ICD-10-CM | POA: Diagnosis not present

## 2015-03-31 DIAGNOSIS — Z720 Tobacco use: Secondary | ICD-10-CM | POA: Insufficient documentation

## 2015-03-31 DIAGNOSIS — Y92410 Unspecified street and highway as the place of occurrence of the external cause: Secondary | ICD-10-CM | POA: Diagnosis not present

## 2015-03-31 DIAGNOSIS — S199XXA Unspecified injury of neck, initial encounter: Secondary | ICD-10-CM | POA: Diagnosis not present

## 2015-03-31 DIAGNOSIS — G8918 Other acute postprocedural pain: Secondary | ICD-10-CM

## 2015-03-31 DIAGNOSIS — M542 Cervicalgia: Secondary | ICD-10-CM

## 2015-03-31 DIAGNOSIS — Y9389 Activity, other specified: Secondary | ICD-10-CM | POA: Insufficient documentation

## 2015-03-31 LAB — PREGNANCY, URINE: Preg Test, Ur: NEGATIVE

## 2015-03-31 MED ORDER — IBUPROFEN 800 MG PO TABS: 800.0000 mg | ORAL_TABLET | Freq: Three times a day (TID) | ORAL | Status: DC | PRN
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
## 2015-04-01 NOTE — ED Notes (Signed)
Pt ambulating independently w/ steady gait on d/c in no acute distress, A&Ox4. D/c instructions reviewed w/ pt and family - pt and family deny any further questions or concerns at present. Rx given x1  

## 2015-04-20 ENCOUNTER — Encounter (HOSPITAL_COMMUNITY): Payer: Self-pay | Admitting: Advanced Practice Midwife

## 2015-04-20 ENCOUNTER — Inpatient Hospital Stay (HOSPITAL_COMMUNITY)
Admission: AD | Admit: 2015-04-20 | Discharge: 2015-04-20 | Discharge status: Against Medical Advice | Payer: Medicaid Other | Source: Ambulatory Visit | Attending: Family Medicine | Admitting: Family Medicine

## 2015-04-20 DIAGNOSIS — F419 Anxiety disorder, unspecified: Secondary | ICD-10-CM | POA: Diagnosis not present

## 2015-04-20 DIAGNOSIS — Z3A10 10 weeks gestation of pregnancy: Secondary | ICD-10-CM | POA: Diagnosis not present

## 2015-04-20 DIAGNOSIS — O131 Gestational [pregnancy-induced] hypertension without significant proteinuria, first trimester: Secondary | ICD-10-CM | POA: Diagnosis not present

## 2015-04-20 DIAGNOSIS — O99341 Other mental disorders complicating pregnancy, first trimester: Secondary | ICD-10-CM | POA: Diagnosis not present

## 2015-04-20 DIAGNOSIS — O9989 Other specified diseases and conditions complicating pregnancy, childbirth and the puerperium: Secondary | ICD-10-CM | POA: Diagnosis present

## 2015-04-20 LAB — URINALYSIS, ROUTINE W REFLEX MICROSCOPIC
BILIRUBIN URINE: NEGATIVE
Glucose, UA: NEGATIVE mg/dL
Hgb urine dipstick: NEGATIVE
Ketones, ur: NEGATIVE mg/dL
Leukocytes, UA: NEGATIVE
Nitrite: NEGATIVE
PH: 7 (ref 5.0–8.0)
PROTEIN: NEGATIVE mg/dL
SPECIFIC GRAVITY, URINE: 1.01 (ref 1.005–1.030)
Urobilinogen, UA: 0.2 mg/dL (ref 0.0–1.0)

## 2015-04-20 LAB — POCT PREGNANCY, URINE: PREG TEST UR: POSITIVE — AB

## 2015-06-11 ENCOUNTER — Telehealth: Payer: Self-pay | Admitting: *Deleted

## 2015-06-11 NOTE — Telephone Encounter (Signed)
Patient called the office very upset and states she is having problems with her depression. 5:00 Call patient- she states she is having problems keeping it together. She is depressed and she loses her temper. She is not pregnant and she is at home with her children. Patient states she has someone to watch them- and she has started to cry. Advised patient she is doing good noticing that she has a problem and calling for help. We talked about how immediate her need is and I advised her to go to [facility] to be evaluated. She is aware that they will talk to her about suicidal intent and how immediate her referrals need to be. She is still crying and states she wants to go and she will go. She is instructed to go To their ED and they will be advised she is coming. She understands. I told her I would call her tomorrow to check on her. Dr Clearance Coots called over to the hospital to let them know about this patient.

## 2015-06-12 ENCOUNTER — Telehealth: Payer: Self-pay | Admitting: *Deleted

## 2015-06-12 NOTE — Telephone Encounter (Signed)
Promised to call patient- LM on VM- just calling to check in with her. Please call the office if she feels she needs to be seen or needs someone to talk to. Hope she is doing well. (Patient did not go to the hospital)

## 2015-07-23 ENCOUNTER — Other Ambulatory Visit: Payer: Self-pay | Admitting: Certified Nurse Midwife

## 2015-08-01 ENCOUNTER — Telehealth: Payer: Self-pay | Admitting: *Deleted

## 2015-08-01 NOTE — Telephone Encounter (Signed)
Patient requested call back- call not clear 1:49 LM on VM to CB

## 2015-08-28 NOTE — Telephone Encounter (Signed)
No response- refile call 

## 2016-02-23 ENCOUNTER — Encounter (HOSPITAL_COMMUNITY): Payer: Self-pay | Admitting: *Deleted

## 2019-12-05 ENCOUNTER — Other Ambulatory Visit: Payer: Self-pay

## 2019-12-05 ENCOUNTER — Encounter (HOSPITAL_COMMUNITY): Payer: Self-pay | Admitting: Emergency Medicine

## 2019-12-05 ENCOUNTER — Ambulatory Visit (HOSPITAL_COMMUNITY)
Admission: EM | Admit: 2019-12-05 | Discharge: 2019-12-05 | Disposition: A | Discharge status: Home / Self Care | Payer: Self-pay | Attending: Family Medicine | Admitting: Family Medicine

## 2019-12-05 DIAGNOSIS — Z202 Contact with and (suspected) exposure to infections with a predominantly sexual mode of transmission: Secondary | ICD-10-CM | POA: Insufficient documentation

## 2019-12-05 DIAGNOSIS — Z3202 Encounter for pregnancy test, result negative: Secondary | ICD-10-CM

## 2019-12-05 DIAGNOSIS — R300 Dysuria: Secondary | ICD-10-CM | POA: Insufficient documentation

## 2019-12-05 LAB — POCT URINALYSIS DIP (DEVICE)
Bilirubin Urine: NEGATIVE
Glucose, UA: NEGATIVE mg/dL
Ketones, ur: NEGATIVE mg/dL
Nitrite: NEGATIVE
Protein, ur: NEGATIVE mg/dL
Specific Gravity, Urine: 1.025 (ref 1.005–1.030)
Urobilinogen, UA: 0.2 mg/dL (ref 0.0–1.0)
pH: 6 (ref 5.0–8.0)

## 2019-12-05 LAB — POC URINE PREG, ED: Preg Test, Ur: NEGATIVE

## 2019-12-05 LAB — POCT PREGNANCY, URINE: Preg Test, Ur: NEGATIVE

## 2019-12-05 MED ORDER — METRONIDAZOLE 500 MG PO TABS: 500.0000 mg | ORAL_TABLET | Freq: Two times a day (BID) | ORAL | 0 refills | Status: DC

## 2019-12-05 MED ORDER — CEFTRIAXONE SODIUM 500 MG IJ SOLR
INTRAMUSCULAR | Status: AC
  Filled 2019-12-05: qty 500

## 2019-12-05 MED ORDER — DOXYCYCLINE HYCLATE 100 MG PO CAPS: 100.0000 mg | ORAL_CAPSULE | Freq: Two times a day (BID) | ORAL | 0 refills | Status: DC

## 2019-12-05 MED ORDER — CEFTRIAXONE SODIUM 500 MG IJ SOLR
500.0000 mg | Freq: Once | INTRAMUSCULAR | Status: AC
  Administered 2019-12-05: 500 mg via INTRAMUSCULAR

## 2019-12-05 NOTE — Discharge Instructions (Signed)

## 2019-12-05 NOTE — ED Triage Notes (Signed)
Pt reports burning with urination and blood in her urine that started today.  Pt was also told that a previous partner tested positive for gonorrhea and he was also with someone who had trichomonas.

## 2019-12-06 LAB — URINE CULTURE: Culture: <10000 — AB

## 2019-12-07 LAB — CERVICOVAGINAL ANCILLARY ONLY
Bacterial vaginitis: POSITIVE — AB
Chlamydia: NEGATIVE
Neisseria Gonorrhea: NEGATIVE
Trichomonas: POSITIVE — AB

## 2019-12-08 ENCOUNTER — Telehealth (HOSPITAL_COMMUNITY): Payer: Self-pay | Admitting: Emergency Medicine

## 2019-12-08 NOTE — Telephone Encounter (Signed)
Trichomonas is positive. Rx metronidazole was given at the urgent care visit. Pt needs education to please refrain from sexual intercourse for 7 days to give the medicine time to work. Sexual partners need to be notified and tested/treated. Condoms may reduce risk of reinfection. Recheck for further evaluation if symptoms are not improving.   Bacterial Vaginosis test is positive.  Prescription for metronidazole was given at the urgent care visit.  Patient contacted by phone and made aware of    results. Pt verbalized understanding and had all questions answered.

## 2019-12-08 NOTE — ED Provider Notes (Signed)
[facility]   [id number] 12/05/19 Arrival Time: 1609  ASSESSMENT & PLAN:  1. Possible exposure to STD   2. Dysuria       Discharge Instructions     You have been given the following today for treatment of suspected gonorrhea and/or chlamydia:  cefTRIAXone (ROCEPHIN) injection 500 mg  Please pick up your prescription for doxycycline 100 mg and begin taking twice daily for the next seven (7) days.  Even though we have treated you today, we have sent testing for sexually transmitted infections. We will notify you of any positive results once they are received. If required, we will prescribe any medications you might need.  Please refrain from all sexual activity for at least the next seven days.     Pending: Vaginal cytology. Urine culture.  Will notify of any positive results. Instructed to refrain from sexual activity for at least seven days.  Reviewed expectations re: course of current medical issues. Questions answered. Outlined signs and symptoms indicating need for more acute intervention. Patient verbalized understanding. After Visit Summary given.   SUBJECTIVE:  Cindy Gardner is a 33 y.o. female who presents with possible exposure to STI; Partner possibly has gonorrhea and trichomonas. She noticed blood in urine today. Questions some vaginal discharge. No vaginal odor. No specific aggravating or alleviating factors reported. Denies: urinary frequency. Mild dysuria. Afebrile. No abdominal or pelvic pain. Normal PO intake wihout n/v. No genital rashes or lesions. Reports that she is sexually active with one female partner OTC treatment: none.  Patient's last menstrual period was 11/28/2019 (approximate).   OBJECTIVE:  Vitals:   12/05/19 1646  BP: (!) 156/94  Pulse: 90  Resp: 16  Temp: 98.5 F (36.9 C)  TempSrc: Oral  SpO2: 100%     General appearance: alert, cooperative, appears stated age and no distress Throat: lips, mucosa, and tongue  normal; teeth and gums normal CV: RRR Lungs: CTAB Back: no CVA tenderness; FROM at waist Abdomen: soft, non-tender GU: deferred Skin: warm and dry Psychological: alert and cooperative; normal mood and affect.    Labs Reviewed  URINE CULTURE - Abnormal; Notable for the following components:      Result Value   Culture   (*)    Value: <10,000 COLONIES/mL INSIGNIFICANT GROWTH Performed at [facility], [address], [place], [place] [address]    All other components within normal limits  POCT URINALYSIS DIP (DEVICE) - Abnormal; Notable for the following components:   Hgb urine dipstick MODERATE (*)    Leukocytes,Ua TRACE (*)    All other components within normal limits  CERVICOVAGINAL ANCILLARY ONLY - Abnormal; Notable for the following components:   Bacterial vaginitis **POSITIVE for Gardnerella vaginalis** (*)    Trichomonas **POSITIVE** (*)    All other components within normal limits  POC URINE PREG, ED  POCT PREGNANCY, URINE    No Known Allergies  Past Medical History:  Diagnosis Date  . Anxiety   . Depression   . Pregnancy induced hypertension    Family History  Problem Relation Age of Onset  . Healthy Mother   . Healthy Father   . Anesthesia problems Neg Hx    Social History   Socioeconomic History  . Marital status: Single    Spouse name: Not on file  . Number of children: Not on file  . Years of education: Not on file  . Highest education level: Not on file  Occupational History  . Not on file  Tobacco  Use  . Smoking status: Current Every Day Smoker    Packs/day: 1.00    Types: Cigarettes  . Smokeless tobacco: Never Used  Substance and Sexual Activity  . Alcohol use: No  . Drug use: No  . Sexual activity: Yes    Partners: Male    Birth control/protection: None  Other Topics Concern  . Not on file  Social History Narrative  . Not on file   Social Determinants of Health   Financial Resource Strain:   . Difficulty of Paying Living  Expenses: Not on file  Food Insecurity:   . Worried About Charity fundraiser in the Last Year: Not on file  . Ran Out of Food in the Last Year: Not on file  Transportation Needs:   . Lack of Transportation (Medical): Not on file  . Lack of Transportation (Non-Medical): Not on file  Physical Activity:   . Days of Exercise per Week: Not on file  . Minutes of Exercise per Session: Not on file  Stress:   . Feeling of Stress : Not on file  Social Connections:   . Frequency of Communication with Friends and Family: Not on file  . Frequency of Social Gatherings with Friends and Family: Not on file  . Attends Religious Services: Not on file  . Active Member of Clubs or Organizations: Not on file  . Attends Archivist Meetings: Not on file  . Marital Status: Not on file  Intimate Partner Violence:   . Fear of Current or Ex-Partner: Not on file  . Emotionally Abused: Not on file  . Physically Abused: Not on file  . Sexually Abused: Not on file          Vanessa Kick, MD 12/08/19 [phone]

## 2022-04-25 ENCOUNTER — Other Ambulatory Visit: Payer: Self-pay

## 2022-04-25 ENCOUNTER — Encounter (HOSPITAL_COMMUNITY): Payer: Self-pay | Admitting: Obstetrics & Gynecology

## 2022-04-25 ENCOUNTER — Inpatient Hospital Stay (HOSPITAL_BASED_OUTPATIENT_CLINIC_OR_DEPARTMENT_OTHER): Payer: Self-pay

## 2022-04-25 ENCOUNTER — Inpatient Hospital Stay (HOSPITAL_COMMUNITY)
Admission: AD | Admit: 2022-04-25 | Discharge: 2022-04-25 | Disposition: A | Discharge status: Home / Self Care | Payer: Self-pay | Attending: Obstetrics & Gynecology | Admitting: Obstetrics & Gynecology

## 2022-04-25 DIAGNOSIS — A084 Viral intestinal infection, unspecified: Secondary | ICD-10-CM | POA: Insufficient documentation

## 2022-04-25 DIAGNOSIS — Z3A16 16 weeks gestation of pregnancy: Secondary | ICD-10-CM | POA: Insufficient documentation

## 2022-04-25 DIAGNOSIS — O99892 Other specified diseases and conditions complicating childbirth: Secondary | ICD-10-CM | POA: Insufficient documentation

## 2022-04-25 DIAGNOSIS — O09292 Supervision of pregnancy with other poor reproductive or obstetric history, second trimester: Secondary | ICD-10-CM | POA: Diagnosis not present

## 2022-04-25 DIAGNOSIS — R109 Unspecified abdominal pain: Secondary | ICD-10-CM

## 2022-04-25 DIAGNOSIS — O26892 Other specified pregnancy related conditions, second trimester: Secondary | ICD-10-CM

## 2022-04-25 LAB — CBC WITH DIFFERENTIAL/PLATELET
Abs Immature Granulocytes: 0.08 10*3/uL — ABNORMAL HIGH (ref 0.00–0.07)
Basophils Absolute: 0 10*3/uL (ref 0.0–0.1)
Basophils Relative: 0 %
Eosinophils Absolute: 0.4 10*3/uL (ref 0.0–0.5)
Eosinophils Relative: 3 %
HCT: 33.7 % — ABNORMAL LOW (ref 36.0–46.0)
Hemoglobin: 11.6 g/dL — ABNORMAL LOW (ref 12.0–15.0)
Immature Granulocytes: 1 %
Lymphocytes Relative: 21 %
Lymphs Abs: 2.9 10*3/uL (ref 0.7–4.0)
MCH: 30.7 pg (ref 26.0–34.0)
MCHC: 34.4 g/dL (ref 30.0–36.0)
MCV: 89.2 fL (ref 80.0–100.0)
Monocytes Absolute: 0.9 10*3/uL (ref 0.1–1.0)
Monocytes Relative: 7 %
Neutro Abs: 9.6 10*3/uL — ABNORMAL HIGH (ref 1.7–7.7)
Neutrophils Relative %: 68 %
Platelets: 196 10*3/uL (ref 150–400)
RBC: 3.78 MIL/uL — ABNORMAL LOW (ref 3.87–5.11)
RDW: 13.1 % (ref 11.5–15.5)
WBC: 13.9 10*3/uL — ABNORMAL HIGH (ref 4.0–10.5)
nRBC: 0 % (ref 0.0–0.2)

## 2022-04-25 LAB — COMPREHENSIVE METABOLIC PANEL
ALT: 13 U/L (ref 0–44)
AST: 17 U/L (ref 15–41)
Albumin: 3.3 g/dL — ABNORMAL LOW (ref 3.5–5.0)
Alkaline Phosphatase: 47 U/L (ref 38–126)
Anion gap: 8 (ref 5–15)
BUN: 7 mg/dL (ref 6–20)
CO2: 19 mmol/L — ABNORMAL LOW (ref 22–32)
Calcium: 9.2 mg/dL (ref 8.9–10.3)
Chloride: 107 mmol/L (ref 98–111)
Creatinine, Ser: 0.57 mg/dL (ref 0.44–1.00)
GFR, Estimated: >60 mL/min (ref >60)
Glucose, Bld: 101 mg/dL — ABNORMAL HIGH (ref 70–99)
Potassium: 3.6 mmol/L (ref 3.5–5.1)
Sodium: 134 mmol/L — ABNORMAL LOW (ref 135–145)
Total Bilirubin: 0.2 mg/dL — ABNORMAL LOW (ref 0.3–1.2)
Total Protein: 6.5 g/dL (ref 6.5–8.1)

## 2022-04-25 LAB — URINALYSIS, ROUTINE W REFLEX MICROSCOPIC
Bilirubin Urine: NEGATIVE
Glucose, UA: NEGATIVE mg/dL
Hgb urine dipstick: NEGATIVE
Ketones, ur: NEGATIVE mg/dL
Leukocytes,Ua: NEGATIVE
Nitrite: NEGATIVE
Protein, ur: NEGATIVE mg/dL
Specific Gravity, Urine: 1.023 (ref 1.005–1.030)
pH: 5 (ref 5.0–8.0)

## 2022-04-25 LAB — LIPASE, BLOOD: Lipase: 27 U/L (ref 11–51)

## 2022-04-25 LAB — HCG, QUANTITATIVE, PREGNANCY: hCG, Beta Chain, Quant, S: 39187 m[IU]/mL — ABNORMAL HIGH (ref <5)

## 2022-04-25 LAB — AMYLASE: Amylase: 69 U/L (ref 28–100)

## 2022-04-25 LAB — POCT PREGNANCY, URINE: Preg Test, Ur: POSITIVE — AB

## 2022-04-25 MED ORDER — HYDROMORPHONE HCL 1 MG/ML IJ SOLN
1.0000 mg | Freq: Once | INTRAMUSCULAR | Status: AC
  Administered 2022-04-25: 1 mg via INTRAVENOUS
  Filled 2022-04-25: qty 1

## 2022-04-25 MED ORDER — FAMOTIDINE IN NACL 20-0.9 MG/50ML-% IV SOLN
20.0000 mg | Freq: Once | INTRAVENOUS | Status: AC
  Administered 2022-04-25: 20 mg via INTRAVENOUS
  Filled 2022-04-25: qty 50

## 2022-04-25 MED ORDER — ONDANSETRON HCL 4 MG PO TABS: 4.0000 mg | ORAL_TABLET | Freq: Every day | ORAL | 0 refills | Status: AC | PRN

## 2022-04-25 MED ORDER — LACTATED RINGERS IV BOLUS
1000.0000 mL | Freq: Once | INTRAVENOUS | Status: AC
  Administered 2022-04-25: 1000 mL via INTRAVENOUS

## 2022-04-25 MED ORDER — ONDANSETRON HCL 4 MG/2ML IJ SOLN
4.0000 mg | Freq: Once | INTRAMUSCULAR | Status: AC
  Administered 2022-04-25: 4 mg via INTRAVENOUS
  Filled 2022-04-25: qty 2

## 2022-04-25 NOTE — Discharge Instructions (Signed)
Prenatal Care Providers ? ?         ?[facility] - accepts patients without insurance ? Phone: [phone] ? ?[facility] @ [facility]  ? Phone: [phone] ? ?[facility]      ? Phone: [phone]   ?         ?[facility] @     ? Phone: [phone] ?         ?[facility] @ [place]  ? Phone: [phone] ? ?[facility] @ [facility] - accepts patients without insurance ? Phone: [phone] ? ?[facility] @ [facility] ?Phone: [phone] ?    ?[facility] - accepts patients without insurance ?Phone: [phone] ? ?[facility]  ?Phone: [phone] ? ?[facility] ?Phone: [phone] ? ?[facility] ?Phone: [phone] ? ?[facility] ?Phone: [phone] ? ?[facility] ?Phone: [phone] ? ?[facility]  ?Phone: [phone] ? ? ? ? ? ? ?                 Safe Medications in Pregnancy  ? ? ?Acne: ?Benzoyl Peroxide ?Salicylic Acid ? ?Backache/Headache: ?Tylenol: 2 regular strength every 4 hours OR ?             2 Extra strength every 6 hours ? ?Colds/Coughs/Allergies: ?Benadryl (alcohol free) 25 mg every 6 hours as needed ?Breath right strips ?Claritin ?Cepacol throat lozenges ?Chloraseptic throat spray ?Cold-Eeze- up to three times per day ?Cough drops, alcohol free ?Flonase (by prescription only) ?Guaifenesin ?Mucinex ?Robitussin DM (plain only, alcohol free) ?Saline nasal spray/drops ?Sudafed (pseudoephedrine) & Actifed ** use only after [redacted] weeks gestation and if you do not have high blood pressure ?Tylenol ?Vicks Vaporub ?Zinc lozenges ?Zyrtec  ? ?Constipation: ?Colace ?Ducolax suppositories ?Fleet enema ?Glycerin suppositories ?Metamucil ?Milk of magnesia ?Miralax ?Senokot ?Smooth move tea ? ?Diarrhea: ?Kaopectate ?Imodium A-D ? ?*NO pepto  Bismol ? ?Hemorrhoids: ?Anusol ?Anusol HC ?Preparation H ?Tucks ? ?Indigestion: ?Tums ?Maalox ?Mylanta ?Zantac  ?Pepcid ? ?Insomnia: ?Benadryl (alcohol free) 25mg every 6 hours as needed ?Tylenol PM ?Unisom, no Gelcaps ? ?Leg Cramps: ?Tums ?MagGel ? ?Nausea/Vomiting:  ?Bonine ?Dramamine ?Emetrol ?Ginger extract ?Sea bands ?Meclizine  ?Nausea medication to take during pregnancy:  ?Unisom (doxylamine succinate 25 mg tablets) Take one tablet daily at bedtime. If symptoms are not adequately controlled, the dose can be increased to a maximum recommended dose of two tablets daily (1/2 tablet in the morning, 1/2 tablet mid-afternoon and one at bedtime). ?Vitamin B6 100mg tablets. Take one tablet twice a day (up to 200 mg per day). ? ?Skin Rashes: ?Aveeno products ?Benadryl cream or 25mg every 6 hours as needed ?Calamine Lotion ?1% cortisone cream ? ?Yeast infection: ?Gyne-lotrimin 7 ?Monistat 7 ? ? ?**If taking multiple medications, please check labels to avoid duplicating the same active ingredients ?**take medication as directed on the label ?** Do not exceed 4000 mg of tylenol in 24 hours ?**Do not take medications that contain aspirin or ibuprofen ? ? ? ? ? ? ? ? ? ?

## 2022-04-25 NOTE — MAU Note (Signed)
Bernardette L Macnaughton is a 36 y.o. at Unknown here in MAU reporting: abdominal and lower back  that began this morning.  States had 2 BM's & thought pain was gas related but cramping has continued.  Denies VB.  Reports +HPT. LMP: 12/30/2021 Onset of complaint: today Pain score: 7/10 Vitals:   04/25/22 0934  BP: (!) 144/94  Pulse: 95  Resp: 20  Temp: 98.5 F (36.9 C)  SpO2: 100%     FHT: Lab orders placed from triage:   UPT

## 2022-04-25 NOTE — MAU Provider Note (Addendum)
History     CSN: [id number]  Arrival date and time: 04/25/22 1517   Event Date/Time   First Provider Initiated Contact with Patient 04/25/22 1012      Chief Complaint  Patient presents with   Cramping   Cindy Gardner is a 36 y.o. [id number] at [redacted]w[redacted]d who presents to MAU for abdominal pain that started this morning. Patient reports the pain started around 4/5AM and came on strong and woke her up out of sleep. Patient thought it was gas and she needed to have a bowel movement and she had one and felt some relief, but then when she woke up again after about an hour, she woke up to severe cramping and came to MAU for evaluation because the cramping was so severe. Patient had another bowel movement in the MAU waiting room and another in the patient room. Patient rating pain as 10/10 and unable to sit still on the bed. Patient denies vaginal bleeding, LOF, vaginal discharge. Patient reports very mild nausea, but denies vomiting. Patient reports generalized abdominal pain that is equal in all quadrants. Patient also describes stool as soft, but not watery. Patient states she had her first baby in middle school and says she felt like this with that baby that she delivered at 23 weeks. Patient denies any sick contacts, and states she has not eaten outside the home recently.  Cervix checked early on in course of patient stay given previous hx of preterm deliveries, cervix closed/long/posterior.  Pt denies VB, LOF, ctx, decreased FM, vaginal discharge/odor/itching. Pt denies vomiting, constipation, diarrhea, or urinary problems. Pt denies fever, chills, fatigue, sweating or changes in appetite. Pt denies SOB or chest pain. Pt denies dizziness, HA, light-headedness, weakness.  Problems this pregnancy include: pt has not yet been seen. Allergies? NKDA Current medications/supplements? none Prenatal care provider? Pt requests list of OB/GYN providers    OB History     Gravida  8   Para  6    Term  2   Preterm  4   AB      Living  6      SAB      IAB      Ectopic      Multiple      Live Births  6           Past Medical History:  Diagnosis Date   Anxiety    Depression    Pregnancy induced hypertension     Past Surgical History:  Procedure Laterality Date   CESAREAN SECTION  11/20/2011   Procedure: CESAREAN SECTION;  Surgeon: Kathreen Cosier, MD;  Location: [facility];  Service: Gynecology;  Laterality: N/A;   CESAREAN SECTION N/A 07/18/2014   Procedure: REPEAT CESAREAN SECTION;  Surgeon: Brock Bad, MD;  Location: [facility];  Service: Obstetrics;  Laterality: N/A;   NO PAST SURGERIES      Family History  Problem Relation Age of Onset   Healthy Mother    Healthy Father    Anesthesia problems Neg Hx     Social History   Tobacco Use   Smoking status: Every Day    Packs/day: 1.00    Types: Cigarettes   Smokeless tobacco: Never  Vaping Use   Vaping Use: Every day  Substance Use Topics   Alcohol use: No   Drug use: No    Allergies: No Known Allergies  Medications Prior to Admission  Medication Sig Dispense Refill Last Dose   doxycycline (VIBRAMYCIN) 100  MG capsule Take 1 capsule (100 mg total) by mouth 2 (two) times daily. 14 capsule 0    ibuprofen (ADVIL,MOTRIN) 800 MG tablet Take 1 tablet (800 mg total) by mouth every 8 (eight) hours as needed. 30 tablet 5    metroNIDAZOLE (FLAGYL) 500 MG tablet Take 1 tablet (500 mg total) by mouth 2 (two) times daily. 14 tablet 0     Review of Systems  Constitutional:  Negative for chills, diaphoresis, fatigue and fever.  Eyes:  Negative for visual disturbance.  Respiratory:  Negative for shortness of breath.   Cardiovascular:  Negative for chest pain.  Gastrointestinal:  Positive for abdominal pain and nausea. Negative for constipation, diarrhea and vomiting.  Genitourinary:  Negative for dysuria, flank pain, frequency, pelvic pain, urgency, vaginal bleeding and vaginal discharge.   Neurological:  Negative for dizziness, weakness, light-headedness and headaches.   Physical Exam   Blood pressure 139/83, pulse 77, temperature 98.5 F (36.9 C), temperature source Oral, resp. rate 17, height 5\' 1"  (1.549 m), weight 85.4 kg, last menstrual period 12/30/2021, SpO2 100 %.  Patient Vitals for the past 24 hrs:  BP Temp Temp src Pulse Resp SpO2 Height Weight  04/25/22 1355 139/83 -- -- 77 17 -- -- --  04/25/22 0934 (!) 144/94 98.5 F (36.9 C) Oral 95 20 100 % -- --  04/25/22 0929 -- -- -- -- -- -- 5\' 1"  (1.549 m) 85.4 kg   Physical Exam Vitals and nursing note reviewed. Exam conducted with a chaperone present.  Constitutional:      General: She is not in acute distress.    Appearance: Normal appearance. She is not ill-appearing, toxic-appearing or diaphoretic.  HENT:     Head: Normocephalic and atraumatic.  Abdominal:     Palpations: Abdomen is soft. There is no mass.     Tenderness: There is abdominal tenderness ((generalized, but most tender area LLQ)). There is no rebound.     Hernia: No hernia is present.  Genitourinary:    General: Normal vulva.     Labia:        Right: No rash, tenderness or lesion.        Left: No rash, tenderness or lesion.      Comments: CE: long/closed/posterior Skin:    General: Skin is warm and dry.  Neurological:     Mental Status: She is alert and oriented to person, place, and time.  Psychiatric:        Mood and Affect: Mood normal.        Behavior: Behavior normal.        Thought Content: Thought content normal.        Judgment: Judgment normal.    Results for orders placed or performed during the hospital encounter of 04/25/22 (from the past 24 hour(s))  Pregnancy, urine POC     Status: Abnormal   Collection Time: 04/25/22  9:41 AM  Result Value Ref Range   Preg Test, Ur POSITIVE (A) NEGATIVE  Urinalysis, Routine w reflex microscopic Urine, Clean Catch     Status: Abnormal   Collection Time: 04/25/22  9:47 AM  Result  Value Ref Range   Color, Urine YELLOW YELLOW   APPearance HAZY (A) CLEAR   Specific Gravity, Urine 1.023 1.005 - 1.030   pH 5.0 5.0 - 8.0   Glucose, UA NEGATIVE NEGATIVE mg/dL   Hgb urine dipstick NEGATIVE NEGATIVE   Bilirubin Urine NEGATIVE NEGATIVE   Ketones, ur NEGATIVE NEGATIVE mg/dL   Protein, ur NEGATIVE  NEGATIVE mg/dL   Nitrite NEGATIVE NEGATIVE   Leukocytes,Ua NEGATIVE NEGATIVE  CBC with Differential/Platelet     Status: Abnormal   Collection Time: 04/25/22 10:24 AM  Result Value Ref Range   WBC 13.9 (H) 4.0 - 10.5 K/uL   RBC 3.78 (L) 3.87 - 5.11 MIL/uL   Hemoglobin 11.6 (L) 12.0 - 15.0 g/dL   HCT 03.4 (L) 91.7 - 91.5 %   MCV 89.2 80.0 - 100.0 fL   MCH 30.7 26.0 - 34.0 pg   MCHC 34.4 30.0 - 36.0 g/dL   RDW 05.6 97.9 - 48.0 %   Platelets 196 150 - 400 K/uL   nRBC 0.0 0.0 - 0.2 %   Neutrophils Relative % 68 %   Neutro Abs 9.6 (H) 1.7 - 7.7 K/uL   Lymphocytes Relative 21 %   Lymphs Abs 2.9 0.7 - 4.0 K/uL   Monocytes Relative 7 %   Monocytes Absolute 0.9 0.1 - 1.0 K/uL   Eosinophils Relative 3 %   Eosinophils Absolute 0.4 0.0 - 0.5 K/uL   Basophils Relative 0 %   Basophils Absolute 0.0 0.0 - 0.1 K/uL   Immature Granulocytes 1 %   Abs Immature Granulocytes 0.08 (H) 0.00 - 0.07 K/uL  Comprehensive metabolic panel     Status: Abnormal   Collection Time: 04/25/22 10:24 AM  Result Value Ref Range   Sodium 134 (L) 135 - 145 mmol/L   Potassium 3.6 3.5 - 5.1 mmol/L   Chloride 107 98 - 111 mmol/L   CO2 19 (L) 22 - 32 mmol/L   Glucose, Bld 101 (H) 70 - 99 mg/dL   BUN 7 6 - 20 mg/dL   Creatinine, Ser 1.65 0.44 - 1.00 mg/dL   Calcium 9.2 8.9 - 53.7 mg/dL   Total Protein 6.5 6.5 - 8.1 g/dL   Albumin 3.3 (L) 3.5 - 5.0 g/dL   AST 17 15 - 41 U/L   ALT 13 0 - 44 U/L   Alkaline Phosphatase 47 38 - 126 U/L   Total Bilirubin 0.2 (L) 0.3 - 1.2 mg/dL   GFR, Estimated >48 >27 mL/min   Anion gap 8 5 - 15  Amylase     Status: None   Collection Time: 04/25/22 10:24 AM  Result  Value Ref Range   Amylase 69 28 - 100 U/L  Lipase, blood     Status: None   Collection Time: 04/25/22 10:24 AM  Result Value Ref Range   Lipase 27 11 - 51 U/L  hCG, quantitative, pregnancy     Status: Abnormal   Collection Time: 04/25/22 10:24 AM  Result Value Ref Range   hCG, Beta Chain, Quant, S 39,187 (H) <5 mIU/mL   No results found.  MAU Course  Procedures  MDM -initial BP elevated but taken during time of extreme pain for patient -abdominal cramping with 3 episodes of soft stool, non-bloody since 4AM this morning -pt rating pain 10/10, 1mg  Dilaudid given, pain reduced to 0/10 -CE: long/closed/posterior -UA: hazy, otherwise WNL -CBC: WNL for pregnancy -CMP: no abnormalities requiring treatment -Amylase: WNL -Lipase: WNL -hCG: 39.187 - : WNL, CL 4cm -likely acute, viral gastroenteritis, will hydrate with 1L LR + 84mf Zofran + 20mg  Pepcid for nausea and discharge home -pt discharged to home in stable condition  Orders Placed This Encounter  Procedures   11m MFM OB LIMITED    Please perform cervical length.    Standing Status:   Standing    Number of Occurrences:   1  Order Specific Question:   Symptom/Reason for Exam    Answer:   Abdominal pain in pregnancy [[id number]]   Urinalysis, Routine w reflex microscopic Urine, Clean Catch    Standing Status:   Standing    Number of Occurrences:   1   CBC with Differential/Platelet    Standing Status:   Standing    Number of Occurrences:   1   Comprehensive metabolic panel    Standing Status:   Standing    Number of Occurrences:   1   Amylase    Standing Status:   Standing    Number of Occurrences:   1   Lipase, blood    Standing Status:   Standing    Number of Occurrences:   1   hCG, quantitative, pregnancy    Standing Status:   Standing    Number of Occurrences:   1   Pregnancy, urine POC    Standing Status:   Standing    Number of Occurrences:   1   Insert peripheral IV    Standing Status:   Standing    Number  of Occurrences:   1   Discharge patient    Order Specific Question:   Discharge disposition    Answer:   01-Home or Self Care [1]    Order Specific Question:   Discharge patient date    Answer:   04/25/2022   Meds ordered this encounter  Medications   HYDROmorphone (DILAUDID) injection 1 mg   lactated ringers bolus 1,000 mL   ondansetron (ZOFRAN) injection 4 mg   famotidine (PEPCID) IVPB 20 mg premix   ondansetron (ZOFRAN) 4 MG tablet    Sig: Take 1 tablet (4 mg total) by mouth daily as needed for nausea or vomiting.    Dispense:  30 tablet    Refill:  0    Order Specific Question:   Supervising Provider    Answer:   Duane Lope H [[id number]]   Assessment and Plan   1. Viral gastroenteritis   2. [redacted] weeks gestation of pregnancy    Allergies as of 04/25/2022   No Known Allergies      Medication List     STOP taking these medications    doxycycline 100 MG capsule Commonly known as: VIBRAMYCIN   ibuprofen 800 MG tablet Commonly known as: ADVIL   metroNIDAZOLE 500 MG tablet Commonly known as: FLAGYL       TAKE these medications    ondansetron 4 MG tablet Commonly known as: Zofran Take 1 tablet (4 mg total) by mouth daily as needed for nausea or vomiting.       -discussed hydration -safe meds in pregnancy list given -OB provider list given -return MAU precautions given -pt discharged to home in stable condition  Joni Reining E Josefine Fuhr 04/25/2022, 2:49 PM

## 2022-05-25 ENCOUNTER — Ambulatory Visit (INDEPENDENT_AMBULATORY_CARE_PROVIDER_SITE_OTHER): Payer: 59

## 2022-05-25 DIAGNOSIS — Z3A20 20 weeks gestation of pregnancy: Secondary | ICD-10-CM

## 2022-05-25 DIAGNOSIS — Z3480 Encounter for supervision of other normal pregnancy, unspecified trimester: Secondary | ICD-10-CM

## 2022-05-25 DIAGNOSIS — O0942 Supervision of pregnancy with grand multiparity, second trimester: Secondary | ICD-10-CM

## 2022-05-25 DIAGNOSIS — F439 Reaction to severe stress, unspecified: Secondary | ICD-10-CM

## 2022-05-25 NOTE — Progress Notes (Signed)
New OB Intake  I connected with  Cindy Gardner on 05/25/22 at  9:15 AM EDT by telephone Video Visit and verified that I am speaking with the correct person using two identifiers. Nurse is located at [facility] and pt is located at Home.  I discussed the limitations, risks, security and privacy concerns of performing an evaluation and management service by telephone and the availability of in person appointments. I also discussed with the patient that there may be a patient responsible charge related to this service. The patient expressed understanding and agreed to proceed.  I explained I am completing New OB Intake today. We discussed her EDD of 10/06/22 that is based on LMP of 12/30/21. Pt is G8/P2406. I reviewed her allergies, medications, Medical/Surgical/OB history, and appropriate screenings. I informed her of [facility] services. [facility] information placed in AVS. Based on history, this is a/an  pregnancy complicated by tobacco dependence  .   Patient Active Problem List   Diagnosis Date Noted   S/P cesarean section 07/18/2014   Depressive disorder, not elsewhere classified 06/04/2014   Tobacco dependence 03/26/2014   Possible exposure to STD 03/01/2014   Vaginitis and vulvovaginitis, unspecified 03/01/2014    Concerns addressed today  Delivery Plans Plans to deliver at [facility]. Patient given information for [facility] website for more information [facility]. Patient is not interested in water birth. Offered upcoming OB visit with CNM to discuss further.  MyChart/Babyscripts MyChart access verified. I explained pt will have some visits in office and some virtually. Babyscripts instructions given and order placed. Patient verifies receipt of registration text/e-mail. Account successfully created and app downloaded.  Blood Pressure Cuff/Weight Scale Patient has private insurance; instructed to purchase blood pressure cuff and bring to first prenatal appt.  Explained after first prenatal appt pt will check weekly and document in Babyscripts. Patient  does not  have weight scale.   Anatomy US Explained first scheduled [place] will be around 19 weeks. Anatomy US ordered. Patient will be notified of date and time.  Labs Discussed Avelina Laine genetic screening with patient. Would like both Panorama and Horizon drawn at new OB visit. Routine prenatal labs needed.  Covid Vaccine Patient has covid vaccine.   Is patient a CenteringPregnancy candidate?  Not a Candidate Declined due to Other   Not a candidate due to  Centering Patient" indicated on sticky note   Social Determinants of Health Food Insecurity: Patient expresses food insecurity. Food Market information given to patient; explained patient may visit at the end of first OB appointment. WIC Referral: Patient is interested in referral to [facility].  Transportation: Patient denies transportation needs. Childcare: Discussed no children allowed at ultrasound appointments. Offered childcare services; patient declines childcare services at this time.  First visit review I reviewed new OB appt with pt. I explained she will have a provider visit that includes prenatal labs, std testing, pap smear, genetic screening, discuss plan of care for pregnancy. Explained pt will be seen by Candelaria Celeste at first visit; encounter routed to appropriate provider. Explained that patient will be seen by pregnancy navigator following visit with provider.   Hamilton Capri, RN 05/25/2022  9:12 AM

## 2022-06-01 ENCOUNTER — Encounter: Payer: Self-pay | Admitting: Family Medicine

## 2022-06-01 ENCOUNTER — Other Ambulatory Visit (HOSPITAL_COMMUNITY)
Admission: RE | Admit: 2022-06-01 | Discharge: 2022-06-01 | Disposition: A | Discharge status: Home / Self Care | Payer: 59 | Source: Ambulatory Visit | Attending: Family Medicine | Admitting: Family Medicine

## 2022-06-01 ENCOUNTER — Encounter: Payer: Self-pay | Admitting: Obstetrics and Gynecology

## 2022-06-01 ENCOUNTER — Ambulatory Visit (INDEPENDENT_AMBULATORY_CARE_PROVIDER_SITE_OTHER): Payer: 59 | Admitting: Licensed Clinical Social Worker

## 2022-06-01 ENCOUNTER — Ambulatory Visit (INDEPENDENT_AMBULATORY_CARE_PROVIDER_SITE_OTHER): Payer: Self-pay | Admitting: Family Medicine

## 2022-06-01 VITALS — BP 146/90 | HR 107 | Wt 192.2 lb

## 2022-06-01 DIAGNOSIS — O34211 Maternal care for low transverse scar from previous cesarean delivery: Secondary | ICD-10-CM | POA: Diagnosis not present

## 2022-06-01 DIAGNOSIS — Z3480 Encounter for supervision of other normal pregnancy, unspecified trimester: Secondary | ICD-10-CM | POA: Diagnosis present

## 2022-06-01 DIAGNOSIS — Z98891 History of uterine scar from previous surgery: Secondary | ICD-10-CM

## 2022-06-01 DIAGNOSIS — F32A Depression, unspecified: Secondary | ICD-10-CM

## 2022-06-01 DIAGNOSIS — O10919 Unspecified pre-existing hypertension complicating pregnancy, unspecified trimester: Secondary | ICD-10-CM

## 2022-06-01 DIAGNOSIS — O99340 Other mental disorders complicating pregnancy, unspecified trimester: Secondary | ICD-10-CM | POA: Diagnosis not present

## 2022-06-01 DIAGNOSIS — F1910 Other psychoactive substance abuse, uncomplicated: Secondary | ICD-10-CM | POA: Diagnosis not present

## 2022-06-01 DIAGNOSIS — O99322 Drug use complicating pregnancy, second trimester: Secondary | ICD-10-CM | POA: Diagnosis not present

## 2022-06-01 DIAGNOSIS — Z3A21 21 weeks gestation of pregnancy: Secondary | ICD-10-CM

## 2022-06-01 DIAGNOSIS — F1410 Cocaine abuse, uncomplicated: Secondary | ICD-10-CM | POA: Diagnosis not present

## 2022-06-01 DIAGNOSIS — O10912 Unspecified pre-existing hypertension complicating pregnancy, second trimester: Secondary | ICD-10-CM | POA: Diagnosis not present

## 2022-06-01 MED ORDER — ASPIRIN 81 MG PO TBEC: 81.0000 mg | DELAYED_RELEASE_TABLET | Freq: Every day | ORAL | 2 refills | Status: AC

## 2022-06-01 MED ORDER — LABETALOL HCL 200 MG PO TABS: 200.0000 mg | ORAL_TABLET | Freq: Two times a day (BID) | ORAL | 3 refills | Status: AC

## 2022-06-01 NOTE — Progress Notes (Signed)
Patient presents for New OB. Patient has no concerns today.  °

## 2022-06-01 NOTE — Progress Notes (Signed)
Subjective:  Cindy Gardner is a [id number] 50w6dbeing seen today for her first obstetrical visit.  Her obstetrical history is significant for  for SVD's, all of which were preterm between 23 and 30 weeks.  Last 2 were term deliveries via C-section.  Her pregnancy is complicated by chronic hypertension, obesity, and substance abuse.  She admits to cocaine use during the early part of this pregnancy.  She has not used cocaine over the last 2 weeks.  She does continue to use marijuana .  She has strong desires to stay sober.  She is contemplating a tubal ligation.  Patient does not intend to breast feed. Pregnancy history fully reviewed.  Patient reports no complaints.  BP (!) 146/90   Pulse (!) 107   Wt 192 lb 3.2 oz (87.2 kg)   LMP 12/30/2021   BMI 36.32 kg/m   HISTORY: OB History  Gravida Para Term Preterm AB Living  _0 SAB IAB Ectopic Multiple Live Births          6    # Outcome Date GA Lbr Len/2nd Weight Sex Delivery Anes PTL Lv  8 Current           7 AB 2020          6 Term 07/18/14 333w5d7 lb 8.8 oz (3.425 kg) F CS-LTranv Spinal  LIV  5 Term 11/20/11 3994w6d lb 4.5 oz (3.755 kg) F CS-LTranv EPI  LIV  4 Preterm 10/04/09 30w32w0dlb 2 oz (1.871 kg) F Vag-Spont EPI  LIV  3 Preterm 02/23/03 30w022w0db 6 oz (1.984 kg) F Vag-Spont EPI  LIV     Birth Comments: 33  2 Preterm 06/22/01 6w0d55w0d 5 oz (1.503 kg) M Vag-Spont EPI  LIV     Birth Comments: 32wks  1 Preterm 08/24/99 [redacted]w[redacted]d 18w0d6 oz (0.624 kg) F Vag-Spont None  LIV     Birth Comments: 23wk gest    Past Medical History:  Diagnosis Date   Anxiety    Depression    Pregnancy induced hypertension     Past Surgical History:  Procedure Laterality Date   CESAREAN SECTION  11/20/2011   Procedure: CESAREAN SECTION;  Surgeon: BernardFrederico HammanLocation: [facility];[place]: [place]necology;  Laterality: N/A;   CESAREAN SECTION N/A 07/18/2014   Procedure: REPEAT CESAREAN SECTION;  Surgeon: CharlesShelly Bombard [place];[place];  Laterality: N/A;   NO PAST SURGERIES      Family History  Problem Relation Age of Onset   Healthy Mother    Healthy Father    Anesthesia problems Neg Hx      Exam  BP (!) 146/90   Pulse (!) 107   Wt 192 lb 3.2 oz (87.2 kg)   LMP 12/30/2021   BMI 36.32 kg/m   Chaperone present during exam  CONSTITUTIONAL: Well-developed, well-nourished female in no acute distress.  HENT:  Normocephalic, atraumatic, External right and left ear normal. Oropharynx is clear and moist EYES: Conjunctivae and EOM are normal. Pupils are equal, round, and reactive to light. No scleral icterus.  NECK: Normal range of motion, supple, no masses.  Normal thyroid.  CARDIOVASCULAR: Normal heart rate noted, regular rhythm RESPIRATORY: Clear to auscultation bilaterally. Effort and breath sounds normal, no problems with respiration noted. BREASTS: Symmetric in size. No masses, skin changes, nipple drainage, or lymphadenopathy. ABDOMEN: Soft,  normal bowel sounds, no distention noted.  No tenderness, rebound or guarding.  PELVIC: Normal appearing external genitalia; normal appearing vaginal mucosa and cervix. No abnormal discharge noted. Normal uterine size, no other palpable masses, no uterine or adnexal tenderness. MUSCULOSKELETAL: Normal range of motion. No tenderness.  No cyanosis, clubbing, or edema.  2+ distal pulses. SKIN: Skin is warm and dry. No rash noted. Not diaphoretic. No erythema. No pallor. NEUROLOGIC: Alert and oriented to person, place, and time. Normal reflexes, muscle tone coordination. No cranial nerve deficit noted. PSYCHIATRIC: Normal mood and affect. Normal behavior. Normal judgment and thought content.    Assessment:    Pregnancy: [id number] Patient Active Problem List   Diagnosis Date Noted   Chronic hypertension affecting pregnancy 06/01/2022   Substance abuse ([place]) 06/01/2022   Supervision of other normal pregnancy, antepartum 05/25/2022   S/P  cesarean section 07/18/2014   Depressive disorder, not elsewhere classified 06/04/2014   Tobacco dependence 03/26/2014   Possible exposure to STD 03/01/2014   Vaginitis and vulvovaginitis, unspecified 03/01/2014      Plan:   1. Supervision of other normal pregnancy, antepartum FHT and FH normal Schedule [place] - Culture, OB Urine - Cervicovaginal ancillary only( [place]) - Panorama Prenatal Test Full Panel - HORIZON CUSTOM - Cytology - PAP( ) - CBC/D/Plt+RPR+Rh+ABO+RubIgG... - Comp Met (CMET) - Protein / creatinine ratio, urine  2. S/P cesarean section Will need RLTCS with BTL  3. Chronic hypertension affecting pregnancy Start labetalol 24m BID Serial UK[place]for growth Start ASA 841m - CBC/D/Plt+RPR+Rh+ABO+RubIgG... - Comp Met (CMET) - Protein / creatinine ratio, urine  4. Substance abuse ([place] Beach[place]iscussed that will need urine tox screen. Will get next appt. Congratulated her on stopping cocaine use. Discussed that cocaine use is associated with preterm labor and placental abruption.    Initial labs obtained Continue prenatal vitamins Reviewed n/v relief measures and warning s/s to report Reviewed recommended weight gain based on pre-gravid BMI Encouraged well-balanced diet Genetic & carrier screening discussed: requests Panorama, requests Horizon  Ultrasound discussed; fetal survey: requested [place]> form faxed if has or is planning to apply for medicaid The nature of [place] [facility] [facility] [facility] MDs and other Advanced Practice Providers was explained to patient; also emphasized that fellows, residents, and students are part of our team.  Problem list reviewed and updated. 75% of 45 min visit spent on counseling and coordination of care.     JaTruett Mainland/25/2023

## 2022-06-02 LAB — CERVICOVAGINAL ANCILLARY ONLY
Chlamydia: NEGATIVE
Comment: NEGATIVE
Comment: NEGATIVE
Comment: NORMAL
Neisseria Gonorrhea: NEGATIVE
Trichomonas: NEGATIVE

## 2022-06-02 LAB — CBC/D/PLT+RPR+RH+ABO+RUBIGG...
Antibody Screen: NEGATIVE
Basophils Absolute: 0 10*3/uL (ref 0.0–0.2)
Basos: 0 %
EOS (ABSOLUTE): 0.6 10*3/uL — ABNORMAL HIGH (ref 0.0–0.4)
Eos: 5 %
HCV Ab: NONREACTIVE
HIV Screen 4th Generation wRfx: NONREACTIVE
Hematocrit: 33.3 % — ABNORMAL LOW (ref 34.0–46.6)
Hemoglobin: 11.3 g/dL (ref 11.1–15.9)
Hepatitis B Surface Ag: NEGATIVE
Immature Grans (Abs): 0.1 10*3/uL (ref 0.0–0.1)
Immature Granulocytes: 1 %
Lymphocytes Absolute: 3.1 10*3/uL (ref 0.7–3.1)
Lymphs: 24 %
MCH: 30.7 pg (ref 26.6–33.0)
MCHC: 33.9 g/dL (ref 31.5–35.7)
MCV: 91 fL (ref 79–97)
Monocytes Absolute: 0.9 10*3/uL (ref 0.1–0.9)
Monocytes: 7 %
Neutrophils Absolute: 8.1 10*3/uL — ABNORMAL HIGH (ref 1.4–7.0)
Neutrophils: 63 %
Platelets: 221 10*3/uL (ref 150–450)
RBC: 3.68 x10E6/uL — ABNORMAL LOW (ref 3.77–5.28)
RDW: 13.1 % (ref 11.7–15.4)
RPR Ser Ql: NONREACTIVE
Rh Factor: POSITIVE
Rubella Antibodies, IGG: 1.49 index (ref >0.99)
WBC: 12.8 10*3/uL — ABNORMAL HIGH (ref 3.4–10.8)

## 2022-06-02 LAB — PROTEIN / CREATININE RATIO, URINE
Creatinine, Urine: 272.4 mg/dL
Protein, Ur: 22.9 mg/dL
Protein/Creat Ratio: 84 mg/g creat (ref 0–200)

## 2022-06-02 LAB — COMPREHENSIVE METABOLIC PANEL
ALT: 7 IU/L (ref 0–32)
AST: 13 IU/L (ref 0–40)
Albumin/Globulin Ratio: 1.5 (ref 1.2–2.2)
Albumin: 4 g/dL (ref 3.9–4.9)
Alkaline Phosphatase: 63 IU/L (ref 44–121)
BUN/Creatinine Ratio: 13 (ref 9–23)
BUN: 9 mg/dL (ref 6–20)
Bilirubin Total: 0.2 mg/dL (ref 0.0–1.2)
CO2: 21 mmol/L (ref 20–29)
Calcium: 9.6 mg/dL (ref 8.7–10.2)
Chloride: 101 mmol/L (ref 96–106)
Creatinine, Ser: 0.67 mg/dL (ref 0.57–1.00)
Globulin, Total: 2.7 g/dL (ref 1.5–4.5)
Glucose: 88 mg/dL (ref 70–99)
Potassium: 4.3 mmol/L (ref 3.5–5.2)
Sodium: 137 mmol/L (ref 134–144)
Total Protein: 6.7 g/dL (ref 6.0–8.5)
eGFR: 116 mL/min/{1.73_m2} (ref >59)

## 2022-06-02 LAB — HCV INTERPRETATION
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
## 2022-06-06 LAB — URINE CULTURE, OB REFLEX

## 2022-06-06 LAB — CULTURE, OB URINE

## 2022-06-07 ENCOUNTER — Encounter: Payer: Self-pay | Admitting: Family Medicine

## 2022-06-07 DIAGNOSIS — R8271 Bacteriuria: Secondary | ICD-10-CM | POA: Insufficient documentation

## 2022-06-10 ENCOUNTER — Encounter: Payer: Self-pay | Admitting: Family Medicine

## 2022-06-10 LAB — CYTOLOGY - PAP
Comment: NEGATIVE
Diagnosis: UNDETERMINED — AB
High risk HPV: NEGATIVE

## 2022-06-14 ENCOUNTER — Encounter: Payer: Self-pay | Admitting: Family Medicine

## 2022-06-14 DIAGNOSIS — R87610 Atypical squamous cells of undetermined significance on cytologic smear of cervix (ASC-US): Secondary | ICD-10-CM | POA: Insufficient documentation

## 2022-06-16 ENCOUNTER — Ambulatory Visit: Payer: 59 | Attending: Obstetrics and Gynecology

## 2022-06-16 ENCOUNTER — Ambulatory Visit: Payer: 59

## 2022-06-22 ENCOUNTER — Encounter: Payer: 59 | Admitting: Obstetrics and Gynecology

## 2022-06-22 ENCOUNTER — Ambulatory Visit: Payer: 59 | Admitting: Licensed Clinical Social Worker

## 2022-07-05 ENCOUNTER — Encounter: Payer: Self-pay | Admitting: Obstetrics

## 2022-08-04 LAB — PANORAMA PRENATAL TEST FULL PANEL:PANORAMA TEST PLUS 5 ADDITIONAL MICRODELETIONS: FETAL FRACTION: 8.7

## 2022-08-04 LAB — HORIZON CUSTOM: REPORT SUMMARY: NEGATIVE

## 2023-02-02 IMAGING — US US MFM OB LIMITED
1 series · 15 of 28 positions shown · non-contrast
Comparison: none

[Series 1: us mfm ob limited · 61 acquisitions, 15 frames shown]
[im 1/61]
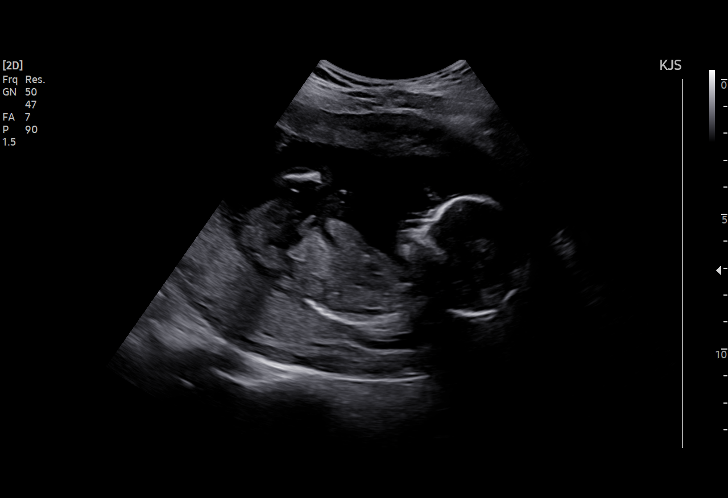
[im 5/61]
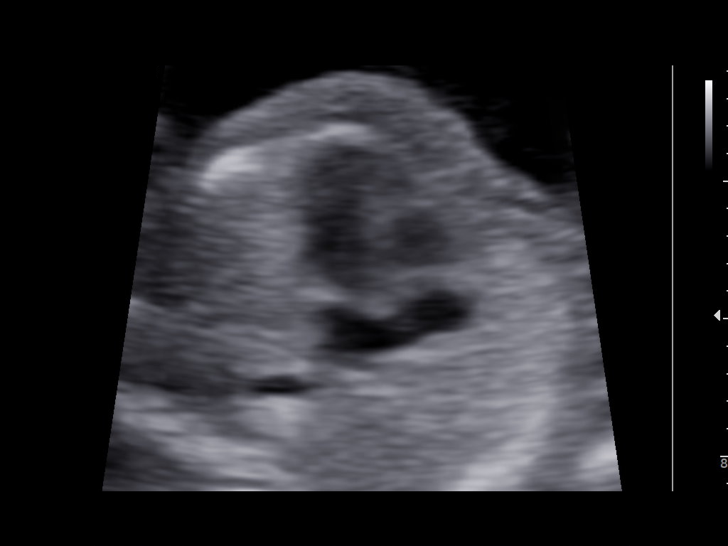
[im 9/61]
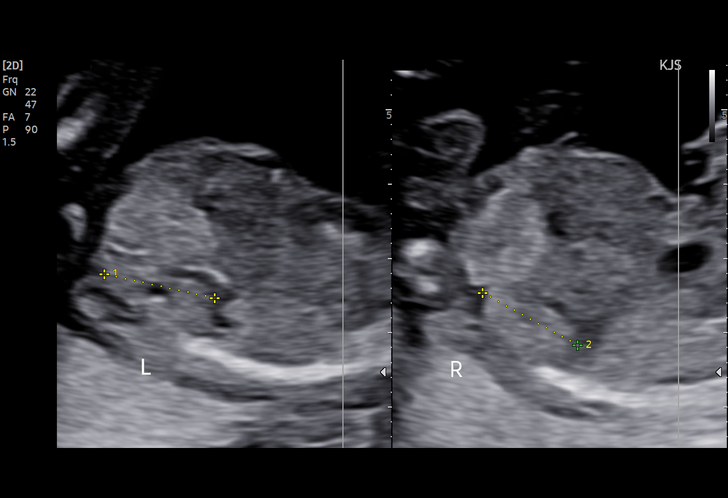
[im 14/61]
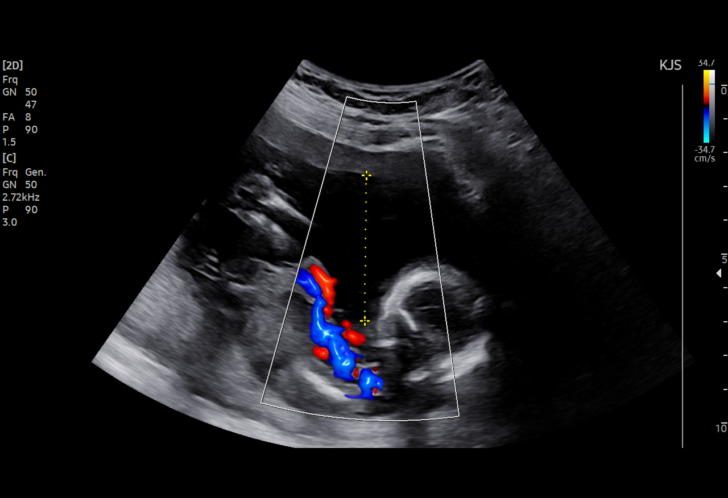
[im 18/61]
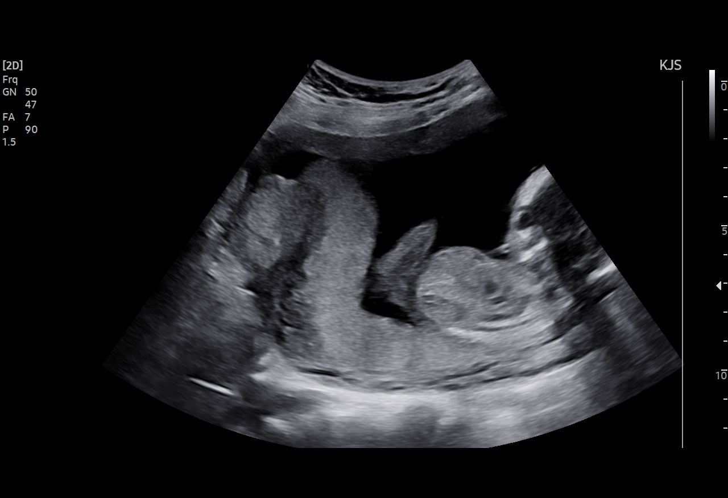
[im 23/61]
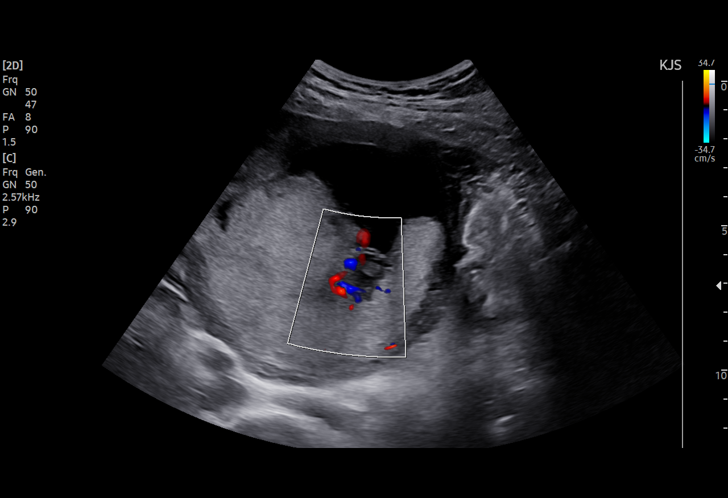
[im 27/61]
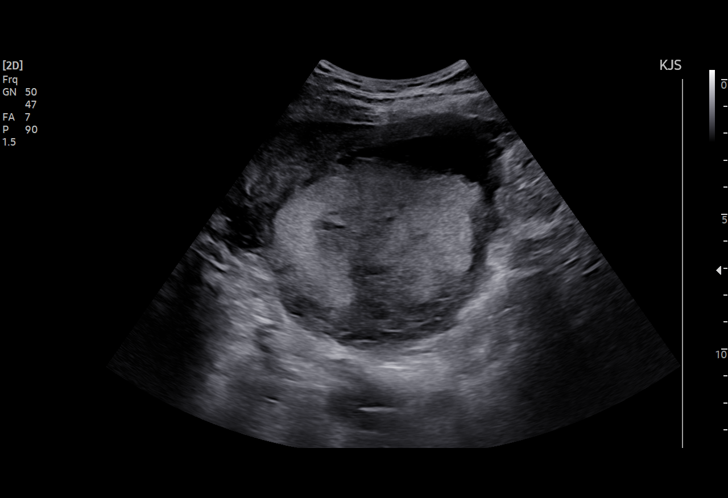
[im 32/61]
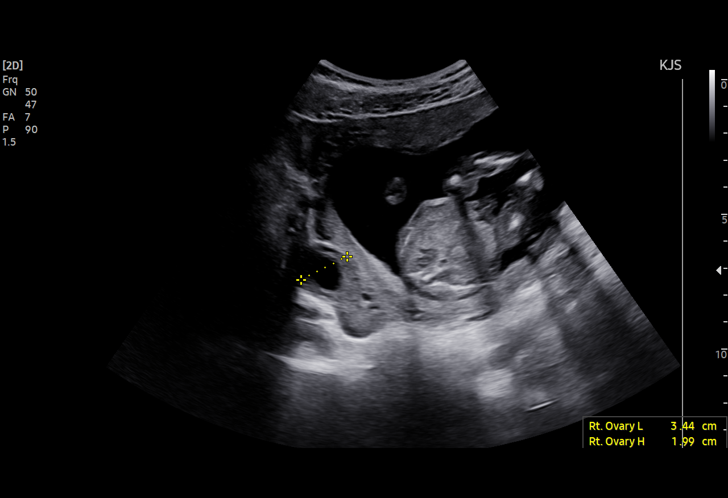
[im 34/61]
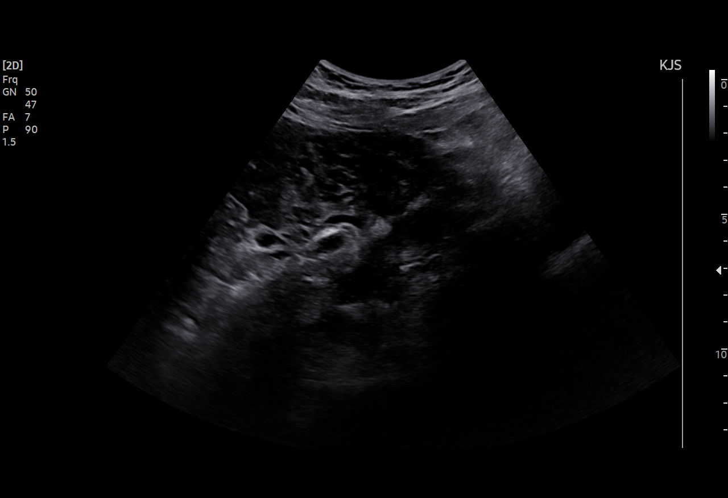
[im 38/61]
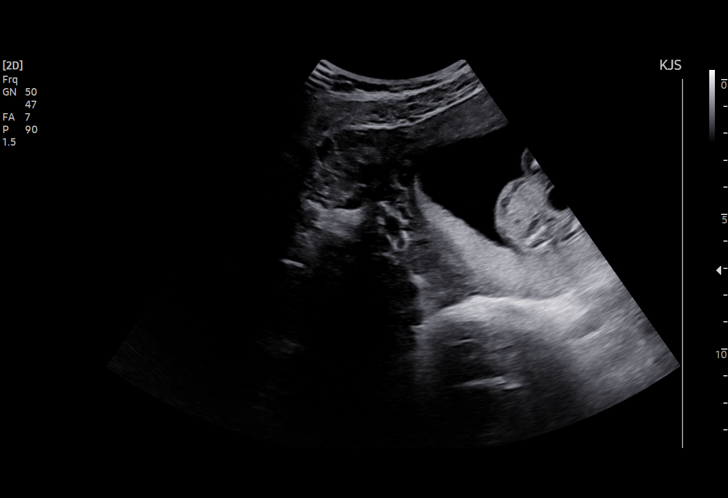
[im 43/61]
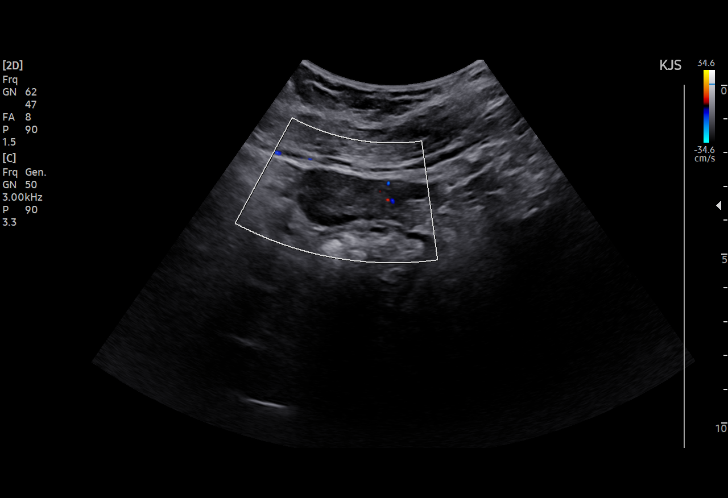
[im 47/61]
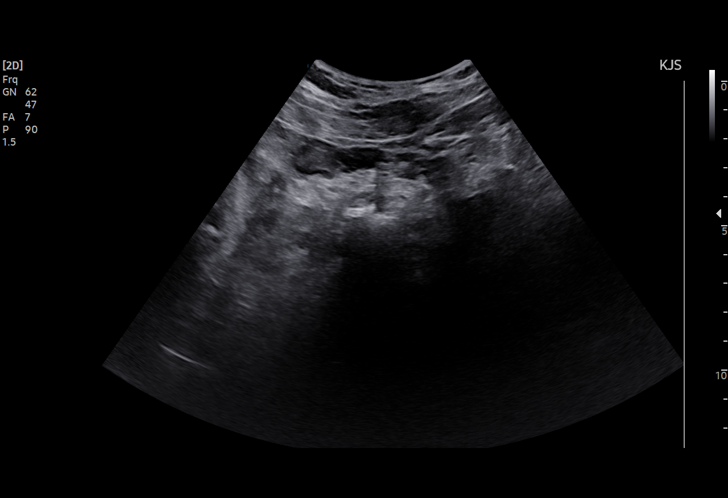
[im 52/61]
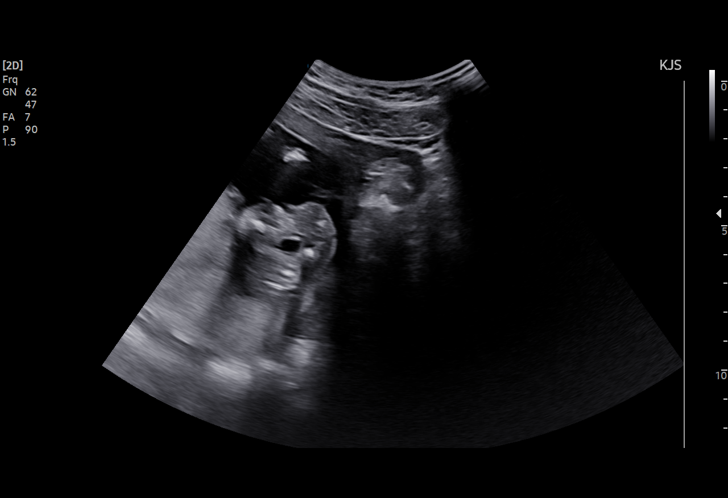
[im 56/61]
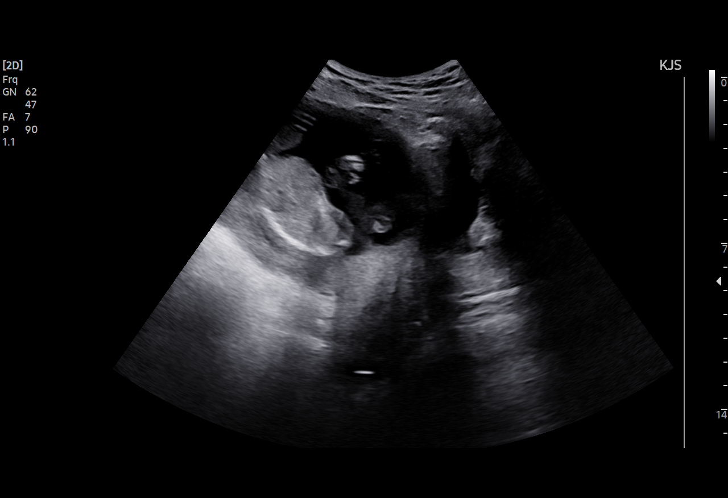
[im 61/61]
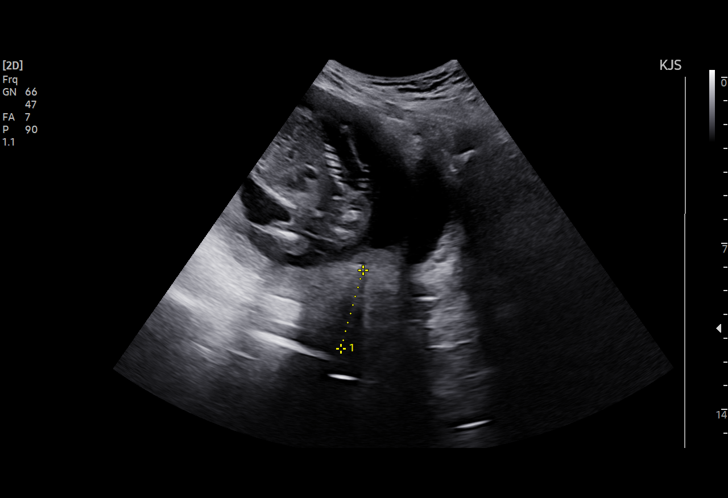

[15 of 28 positions shown; findings below may reference images not displayed]

1  US MFM OB LIMITED                     76815.01    YONG YEUN SINGO YUDO

Indications

 Abdominal pain in pregnancy
 Encounter for cervical length
 16 weeks gestation of pregnancy
 Poor obstetric history: Previous preterm
 delivery, antepartum (x4, earliest 23w)
Fetal Evaluation

 Num Of Fetuses:         1
 Fetal Heart Rate(bpm):  143
 Cardiac Activity:       Observed
 Presentation:           Cephalic
 Placenta:               Posterior Fundal

 Amniotic Fluid
 AFI FV:      Within normal limits

                             Largest Pocket(cm)

Biometry

 LV:        5.5  mm
OB History

 Gravidity:    8         Term:   2        Prem:   4
 Living:       6
Gestational Age

 LMP:           16w 4d        Date:  12/30/21                  EDD:   10/06/22
 Best:          16w 4d     Det. By:  LMP  (12/30/21)          EDD:   10/06/22
Anatomy
 Diaphragm:             Appears normal         Bladder:                Appears normal
 Kidneys:               Appear normal
Cervix Uterus Adnexa

 Cervix
 Length:              4  cm.
 Normal appearance by transabdominal scan.

 Uterus
 No abnormality visualized.

 Right Ovary
 Size(cm)     3.44   x   1.91   x  1.99      Vol(ml):
 Within normal limits.

 Left Ovary
 Size(cm)     4.15   x   3.21   x  1.58      Vol(ml):
 Within normal limits.

 Cul De Sac
 No free fluid seen.

 Adnexa
 No adnexal mass visualized.
Comments

 This patient presented to the [facility] due to abdominal pain.

 A limited ultrasound performed today shows a live singleton
 gestation in the vertex presentation.

 There was normal amniotic fluid noted.

 A normal-appearing posterior placenta is noted.
